# Patient Record
Sex: Male | Born: 1985
Health system: Southern US, Community
[De-identification: ages and names within clinical notes are randomized; demographics above are authoritative.]

## PROBLEM LIST (undated history)

## (undated) HISTORY — PX: FOOT SURGERY: SHX648

---
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
## 2009-03-10 ENCOUNTER — Emergency Department (HOSPITAL_COMMUNITY): Admission: EM | Admit: 2009-03-10 | Discharge: 2009-03-10 | Payer: Self-pay | Admitting: Family Medicine

## 2011-02-15 LAB — GC/CHLAMYDIA PROBE AMP, GENITAL: GC Probe Amp, Genital: POSITIVE — AB

## 2012-12-24 ENCOUNTER — Emergency Department (HOSPITAL_COMMUNITY)
Admission: EM | Admit: 2012-12-24 | Discharge: 2012-12-24 | Disposition: A | Discharge status: Home / Self Care | Payer: No Typology Code available for payment source | Attending: Emergency Medicine | Admitting: Emergency Medicine

## 2012-12-24 ENCOUNTER — Encounter (HOSPITAL_COMMUNITY): Payer: Self-pay | Admitting: *Deleted

## 2012-12-24 DIAGNOSIS — S4980XA Other specified injuries of shoulder and upper arm, unspecified arm, initial encounter: Secondary | ICD-10-CM | POA: Insufficient documentation

## 2012-12-24 DIAGNOSIS — F17200 Nicotine dependence, unspecified, uncomplicated: Secondary | ICD-10-CM | POA: Insufficient documentation

## 2012-12-24 DIAGNOSIS — IMO0002 Reserved for concepts with insufficient information to code with codable children: Secondary | ICD-10-CM | POA: Insufficient documentation

## 2012-12-24 DIAGNOSIS — Y92410 Unspecified street and highway as the place of occurrence of the external cause: Secondary | ICD-10-CM | POA: Insufficient documentation

## 2012-12-24 DIAGNOSIS — Y9389 Activity, other specified: Secondary | ICD-10-CM | POA: Insufficient documentation

## 2012-12-24 DIAGNOSIS — M549 Dorsalgia, unspecified: Secondary | ICD-10-CM

## 2012-12-24 DIAGNOSIS — S46909A Unspecified injury of unspecified muscle, fascia and tendon at shoulder and upper arm level, unspecified arm, initial encounter: Secondary | ICD-10-CM | POA: Insufficient documentation

## 2012-12-24 MED ORDER — TRAMADOL HCL 50 MG PO TABS: 50.0000 mg | ORAL_TABLET | Freq: Four times a day (QID) | ORAL | Status: DC | PRN
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
## 2012-12-26 NOTE — ED Provider Notes (Signed)
Medical screening examination/treatment/procedure(s) were performed by non-physician practitioner and as supervising physician I was immediately available for consultation/collaboration.   Suzi Roots, MD 12/26/12 [phone]

## 2013-01-05 ENCOUNTER — Emergency Department (HOSPITAL_COMMUNITY)
Admission: EM | Admit: 2013-01-05 | Discharge: 2013-01-05 | Disposition: A | Discharge status: Home / Self Care | Payer: No Typology Code available for payment source | Attending: Emergency Medicine | Admitting: Emergency Medicine

## 2013-01-05 ENCOUNTER — Emergency Department (HOSPITAL_COMMUNITY): Payer: No Typology Code available for payment source

## 2013-01-05 ENCOUNTER — Encounter (HOSPITAL_COMMUNITY): Payer: Self-pay

## 2013-01-05 DIAGNOSIS — S32009A Unspecified fracture of unspecified lumbar vertebra, initial encounter for closed fracture: Secondary | ICD-10-CM

## 2013-01-05 DIAGNOSIS — F17200 Nicotine dependence, unspecified, uncomplicated: Secondary | ICD-10-CM | POA: Insufficient documentation

## 2013-01-05 DIAGNOSIS — R319 Hematuria, unspecified: Secondary | ICD-10-CM | POA: Insufficient documentation

## 2013-01-05 DIAGNOSIS — Y939 Activity, unspecified: Secondary | ICD-10-CM | POA: Insufficient documentation

## 2013-01-05 DIAGNOSIS — Y929 Unspecified place or not applicable: Secondary | ICD-10-CM | POA: Insufficient documentation

## 2013-01-05 DIAGNOSIS — R109 Unspecified abdominal pain: Secondary | ICD-10-CM | POA: Insufficient documentation

## 2013-01-05 DIAGNOSIS — X58XXXA Exposure to other specified factors, initial encounter: Secondary | ICD-10-CM | POA: Insufficient documentation

## 2013-01-05 LAB — BASIC METABOLIC PANEL
BUN: 23 mg/dL (ref 6–23)
Calcium: 8.9 mg/dL (ref 8.4–10.5)
Creatinine, Ser: 1.36 mg/dL — ABNORMAL HIGH (ref 0.50–1.35)
GFR calc Af Amer: 83 mL/min — ABNORMAL LOW (ref >90)
GFR calc non Af Amer: 71 mL/min — ABNORMAL LOW (ref >90)
Glucose, Bld: 104 mg/dL — ABNORMAL HIGH (ref 70–99)

## 2013-01-05 LAB — URINALYSIS, ROUTINE W REFLEX MICROSCOPIC
Bilirubin Urine: NEGATIVE
Hgb urine dipstick: NEGATIVE
Ketones, ur: NEGATIVE mg/dL
Nitrite: NEGATIVE
Urobilinogen, UA: 0.2 mg/dL (ref 0.0–1.0)

## 2013-01-05 MED ORDER — HYDROCODONE-ACETAMINOPHEN 5-325 MG PO TABS: 2.0000 | ORAL_TABLET | ORAL | Status: DC | PRN

## 2013-01-05 MED ORDER — HYDROCODONE-ACETAMINOPHEN 5-325 MG PO TABS
2.0000 | ORAL_TABLET | Freq: Once | ORAL | Status: AC
  Administered 2013-01-05: 2 via ORAL
  Filled 2013-01-05: qty 2
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
## 2013-01-05 NOTE — ED Notes (Signed)
Pt presents with no acute distress.  Pt reports stomach pain x 1 month pain is no t constant- no correlation with pain.  Blood appeared 2 weeks ago. Pt denies discharge.  Pt is sexually active.  Pt had sex last 6 weeks ago. Denies any discharge. Pt c/o of RLQ pain and Rt flank pain  Denies N/V/D and fever

## 2013-01-05 NOTE — ED Notes (Signed)
Patient transported to CT 

## 2013-01-06 NOTE — ED Provider Notes (Signed)
Medical screening examination/treatment/procedure(s) were performed by non-physician practitioner and as supervising physician I was immediately available for consultation/collaboration.   Suzi Roots, MD 01/06/13 [phone]

## 2014-01-30 ENCOUNTER — Emergency Department (HOSPITAL_COMMUNITY)
Admission: EM | Admit: 2014-01-30 | Discharge: 2014-01-31 | Disposition: A | Discharge status: Home / Self Care | Payer: Self-pay | Attending: Emergency Medicine | Admitting: Emergency Medicine

## 2014-01-30 ENCOUNTER — Encounter (HOSPITAL_COMMUNITY): Payer: Self-pay | Admitting: Emergency Medicine

## 2014-01-30 DIAGNOSIS — L03119 Cellulitis of unspecified part of limb: Principal | ICD-10-CM

## 2014-01-30 DIAGNOSIS — L02619 Cutaneous abscess of unspecified foot: Secondary | ICD-10-CM | POA: Insufficient documentation

## 2014-01-30 DIAGNOSIS — L03116 Cellulitis of left lower limb: Secondary | ICD-10-CM

## 2014-01-30 DIAGNOSIS — IMO0001 Reserved for inherently not codable concepts without codable children: Secondary | ICD-10-CM | POA: Insufficient documentation

## 2014-01-30 DIAGNOSIS — F17200 Nicotine dependence, unspecified, uncomplicated: Secondary | ICD-10-CM | POA: Insufficient documentation

## 2014-01-30 NOTE — ED Notes (Signed)
Pt states he thinks something bit him on the left foot on Monday  Pt states it started as a small bump on his 4th toe and now he has redness and swelling to his entire foot  Pt states it is very painful

## 2014-01-31 MED ORDER — IBUPROFEN 200 MG PO TABS
600.0000 mg | ORAL_TABLET | Freq: Once | ORAL | Status: AC
  Administered 2014-01-31: 600 mg via ORAL
  Filled 2014-01-31: qty 3

## 2014-01-31 MED ORDER — HYDROCODONE-ACETAMINOPHEN 5-325 MG PO TABS: 1.0000 | ORAL_TABLET | Freq: Four times a day (QID) | ORAL | Status: DC | PRN

## 2014-01-31 MED ORDER — SULFAMETHOXAZOLE-TMP DS 800-160 MG PO TABS
2.0000 | ORAL_TABLET | Freq: Once | ORAL | Status: AC
  Administered 2014-01-31: 2 via ORAL
  Filled 2014-01-31: qty 2

## 2014-01-31 MED ORDER — SULFAMETHOXAZOLE-TRIMETHOPRIM 800-160 MG PO TABS: 1.0000 | ORAL_TABLET | Freq: Two times a day (BID) | ORAL | Status: DC

## 2014-01-31 MED ORDER — CEPHALEXIN 500 MG PO CAPS
500.0000 mg | ORAL_CAPSULE | Freq: Once | ORAL | Status: AC
  Administered 2014-01-31: 500 mg via ORAL
  Filled 2014-01-31: qty 1

## 2014-01-31 MED ORDER — CEPHALEXIN 500 MG PO CAPS: 500.0000 mg | ORAL_CAPSULE | Freq: Four times a day (QID) | ORAL | Status: DC

## 2014-01-31 MED ORDER — IBUPROFEN 600 MG PO TABS: 600.0000 mg | ORAL_TABLET | Freq: Four times a day (QID) | ORAL | Status: DC | PRN

## 2014-01-31 MED ORDER — HYDROCODONE-ACETAMINOPHEN 5-325 MG PO TABS
1.0000 | ORAL_TABLET | Freq: Once | ORAL | Status: AC
Start: 2014-01-31 — End: 2014-01-31
  Administered 2014-01-31: 1 via ORAL
  Filled 2014-01-31: qty 1

## 2014-01-31 NOTE — ED Provider Notes (Addendum)
CSN: [id number]     Arrival date & time 01/30/14  2226 History   First MD Initiated Contact with Patient 01/30/14 2338     Chief Complaint  Patient presents with  . Foot Pain     (Consider location/radiation/quality/duration/timing/severity/associated sxs/prior Treatment) HPI Comments: Pt comes in with cc of left foot pain. He noticed a little bump on his 4th toe on Monday, and overtime, soke pain in that area. This evening however, the pain shot up quickly, along with increased swelling and redness. He has no medical problems, no ivda. No n/v/f/c. Pt has pain over the distal part of his foot, and is described as throbbing pain.  The history is provided by the patient.    History reviewed. No pertinent past medical history. History reviewed. No pertinent past surgical history. History reviewed. No pertinent family history. History  Substance Use Topics  . Smoking status: Current Every Day Smoker    Types: Cigarettes  . Smokeless tobacco: Never Used  . Alcohol Use: No    Review of Systems  Constitutional: Negative for fever and chills.  Musculoskeletal: Positive for arthralgias and myalgias.  Skin: Positive for rash.  Allergic/Immunologic: Negative for immunocompromised state.  Hematological: Does not bruise/bleed easily.      Allergies  Review of patient's allergies indicates no known allergies.  Home Medications   Current Outpatient Rx  Name  Route  Sig  Dispense  Refill  . acetaminophen (TYLENOL) 500 MG tablet   Oral   Take 1,000 mg by mouth every 6 (six) hours as needed for headache.         . tetrahydrozoline 0.05 % ophthalmic solution   Both Eyes   Place 1 drop into both eyes as needed (dry eyes).          BP 145/85  Pulse 95  Temp(Src) 98.6 F (37 C) (Oral)  Resp 20  SpO2 98% Physical Exam  Nursing note and vitals reviewed. Constitutional: He is oriented to person, place, and time. He appears well-developed.  HENT:  Head: Normocephalic and  atraumatic.  Eyes: Conjunctivae and EOM are normal. Pupils are equal, round, and reactive to light.  Neck: Normal range of motion. Neck supple.  Cardiovascular: Normal rate and regular rhythm.   Pulmonary/Chest: Effort normal and breath sounds normal.  Abdominal: Soft. Bowel sounds are normal. He exhibits no distension. There is no tenderness. There is no rebound and no guarding.  Musculoskeletal:  Left foot - erythema, edema and warm to touch. Pt has tenderness over the distal 1/3rd foot - most tender over the MCP. Please see the attached picture  Neurological: He is alert and oriented to person, place, and time.  Skin: Skin is warm.    ED Course  Procedures (including critical care time) Labs Review Labs Reviewed - No data to display Imaging Review No results found.   EKG Interpretation None     LEFT FOOT   INCREASED TENDERNESS AND EDEMA AT THE TOES    MDM   Final diagnoses:  None    Pt comes in with cellulitis of the foot. No evidence of abscess - but there is fair amount of edema.  Pt has no insurance  -will start him on bactrim and keflex. Will demarcate the wound. Pt is immunocompetent, 0 SIRs criteria, and i believe will respond well to outpatient therapy. so we have asked him to come back for wound follow pp in 2 days. Return precautions discussed.   Derwood KaplanAnkit Ellijah Leffel, MD 01/31/14 0104  Deloras Reichard  Rhunette Croft, MD 01/31/14 1610

## 2014-01-31 NOTE — Discharge Instructions (Signed)
You have foot cellulitis. Antibiotics started in the ER. Return to the ER if the symptoms are getting worse (fevers, pus drainage, worsening redness, pain, swelling) immediately, otherwise, see THE URGENT [facility]ARE in 2 days for wound recheck.   Cellulitis Cellulitis is an infection of the skin and the tissue beneath it. The infected area is usually red and tender. Cellulitis occurs most often in the arms and lower legs.  CAUSES  Cellulitis is caused by bacteria that enter the skin through cracks or cuts in the skin. The most common types of bacteria that cause cellulitis are Staphylococcus and Streptococcus. SYMPTOMS   Redness and warmth.  Swelling.  Tenderness or pain.  Fever. DIAGNOSIS  Your caregiver can usually determine what is wrong based on a physical exam. Blood tests may also be done. TREATMENT  Treatment usually involves taking an antibiotic medicine. HOME CARE INSTRUCTIONS   Take your antibiotics as directed. Finish them even if you start to feel better.  Keep the infected arm or leg elevated to reduce swelling.  Apply a warm cloth to the affected area up to 4 times per day to relieve pain.  Only take over-the-counter or prescription medicines for pain, discomfort, or fever as directed by your caregiver.  Keep all follow-up appointments as directed by your caregiver. SEEK MEDICAL CARE IF:   You notice red streaks coming from the infected area.  Your red area gets larger or turns dark in color.  Your bone or joint underneath the infected area becomes painful after the skin has healed.  Your infection returns in the same area or another area.  You notice a swollen bump in the infected area.  You develop new symptoms. SEEK IMMEDIATE MEDICAL CARE IF:   You have a fever.  You feel very sleepy.  You develop vomiting or diarrhea.  You have a general ill feeling (malaise) with muscle aches and pains. MAKE SURE YOU:   Understand these instructions.  Will watch  your condition.  Will get help right away if you are not doing well or get worse. Document Released: 08/03/2005 Document Revised: 04/24/2012 Document Reviewed: 01/09/2012 [facility] Patient Information 2014 [place], [place]. RICE: Routine Care for Injuries The routine care of many injuries includes Rest, Ice, Compression, and Elevation (RICE). HOME CARE INSTRUCTIONS  Rest is needed to allow your body to heal. Routine activities can usually be resumed when comfortable. Injured tendons and bones can take up to 6 weeks to heal. Tendons are the cord-like structures that attach muscle to bone.  Ice following an injury helps keep the swelling down and reduces pain.  Put ice in a plastic bag.  Place a towel between your skin and the bag.  Leave the ice on for 15-20 minutes, 03-04 times a day. Do this while awake, for the first 24 to 48 hours. After that, continue as directed by your caregiver.  Compression helps keep swelling down. It also gives support and helps with discomfort. If an elastic bandage has been applied, it should be removed and reapplied every 3 to 4 hours. It should not be applied tightly, but firmly enough to keep swelling down. Watch fingers or toes for swelling, bluish discoloration, coldness, numbness, or excessive pain. If any of these problems occur, remove the bandage and reapply loosely. Contact your caregiver if these problems continue.  Elevation helps reduce swelling and decreases pain. With extremities, such as the arms, hands, legs, and feet, the injured area should be placed near or above the level of the heart,  if possible. SEEK IMMEDIATE MEDICAL CARE IF:  You have persistent pain and swelling.  You develop redness, numbness, or unexpected weakness.  Your symptoms are getting worse rather than improving after several days. These symptoms may indicate that further evaluation or further X-rays are needed. Sometimes, X-rays may not show a small broken bone (fracture)  until 1 week or 10 days later. Make a follow-up appointment with your caregiver. Ask when your X-ray results will be ready. Make sure you get your X-ray results. Document Released: 02/05/2001 Document Revised: 01/16/2012 Document Reviewed: 03/25/2011 [facility] Patient Information 2014 [place].

## 2014-02-01 ENCOUNTER — Inpatient Hospital Stay (HOSPITAL_COMMUNITY): Payer: Self-pay

## 2014-02-01 ENCOUNTER — Encounter (HOSPITAL_COMMUNITY): Payer: Self-pay | Admitting: Emergency Medicine

## 2014-02-01 ENCOUNTER — Inpatient Hospital Stay (HOSPITAL_COMMUNITY)
Admission: EM | Admit: 2014-02-01 | Discharge: 2014-02-13 | DRG: 573 | Disposition: A | Discharge status: Home Health | Payer: Self-pay | Attending: Internal Medicine | Admitting: Internal Medicine

## 2014-02-01 ENCOUNTER — Emergency Department (HOSPITAL_COMMUNITY): Payer: Self-pay

## 2014-02-01 DIAGNOSIS — B351 Tinea unguium: Secondary | ICD-10-CM | POA: Diagnosis present

## 2014-02-01 DIAGNOSIS — F1210 Cannabis abuse, uncomplicated: Secondary | ICD-10-CM | POA: Diagnosis present

## 2014-02-01 DIAGNOSIS — R6510 Systemic inflammatory response syndrome (SIRS) of non-infectious origin without acute organ dysfunction: Secondary | ICD-10-CM

## 2014-02-01 DIAGNOSIS — K5900 Constipation, unspecified: Secondary | ICD-10-CM | POA: Diagnosis present

## 2014-02-01 DIAGNOSIS — L03119 Cellulitis of unspecified part of limb: Principal | ICD-10-CM

## 2014-02-01 DIAGNOSIS — Z79899 Other long term (current) drug therapy: Secondary | ICD-10-CM

## 2014-02-01 DIAGNOSIS — L03116 Cellulitis of left lower limb: Secondary | ICD-10-CM | POA: Diagnosis present

## 2014-02-01 DIAGNOSIS — H04129 Dry eye syndrome of unspecified lacrimal gland: Secondary | ICD-10-CM | POA: Diagnosis present

## 2014-02-01 DIAGNOSIS — L02619 Cutaneous abscess of unspecified foot: Principal | ICD-10-CM | POA: Diagnosis present

## 2014-02-01 DIAGNOSIS — L089 Local infection of the skin and subcutaneous tissue, unspecified: Secondary | ICD-10-CM | POA: Diagnosis present

## 2014-02-01 DIAGNOSIS — D72829 Elevated white blood cell count, unspecified: Secondary | ICD-10-CM | POA: Diagnosis present

## 2014-02-01 DIAGNOSIS — L988 Other specified disorders of the skin and subcutaneous tissue: Secondary | ICD-10-CM | POA: Diagnosis present

## 2014-02-01 DIAGNOSIS — A4902 Methicillin resistant Staphylococcus aureus infection, unspecified site: Secondary | ICD-10-CM | POA: Diagnosis present

## 2014-02-01 DIAGNOSIS — A419 Sepsis, unspecified organism: Secondary | ICD-10-CM | POA: Diagnosis present

## 2014-02-01 DIAGNOSIS — F17200 Nicotine dependence, unspecified, uncomplicated: Secondary | ICD-10-CM | POA: Diagnosis present

## 2014-02-01 DIAGNOSIS — E875 Hyperkalemia: Secondary | ICD-10-CM | POA: Diagnosis present

## 2014-02-01 LAB — CBC
HCT: 44.7 % (ref 39.0–52.0)
HCT: 46 % (ref 39.0–52.0)
HEMOGLOBIN: 16.2 g/dL (ref 13.0–17.0)
Hemoglobin: 15.6 g/dL (ref 13.0–17.0)
MCH: 31.1 pg (ref 26.0–34.0)
MCH: 31.5 pg (ref 26.0–34.0)
MCHC: 34.9 g/dL (ref 30.0–36.0)
MCHC: 35.2 g/dL (ref 30.0–36.0)
MCV: 89.2 fL (ref 78.0–100.0)
MCV: 89.5 fL (ref 78.0–100.0)
PLATELETS: 179 10*3/uL (ref 150–400)
PLATELETS: 154 10*3/uL (ref 150–400)
RBC: 5.01 MIL/uL (ref 4.22–5.81)
RBC: 5.14 MIL/uL (ref 4.22–5.81)
RDW: 13.1 % (ref 11.5–15.5)
RDW: 13.1 % (ref 11.5–15.5)
WBC: 16.1 10*3/uL — AB (ref 4.0–10.5)
WBC: 14 10*3/uL — ABNORMAL HIGH (ref 4.0–10.5)

## 2014-02-01 LAB — COMPREHENSIVE METABOLIC PANEL
ALT: 14 U/L (ref 0–53)
AST: 14 U/L (ref 0–37)
Albumin: 3.2 g/dL — ABNORMAL LOW (ref 3.5–5.2)
Alkaline Phosphatase: 86 U/L (ref 39–117)
BUN: 17 mg/dL (ref 6–23)
CO2: 24 mEq/L (ref 19–32)
Calcium: 8.7 mg/dL (ref 8.4–10.5)
Chloride: 98 mEq/L (ref 96–112)
Creatinine, Ser: 1 mg/dL (ref 0.50–1.35)
GFR calc Af Amer: >90 mL/min (ref >90)
GFR calc non Af Amer: >90 mL/min (ref >90)
GLUCOSE: 80 mg/dL (ref 70–99)
Potassium: 4.8 mEq/L (ref 3.7–5.3)
SODIUM: 135 meq/L — AB (ref 137–147)
TOTAL PROTEIN: 6.8 g/dL (ref 6.0–8.3)
Total Bilirubin: 0.4 mg/dL (ref 0.3–1.2)

## 2014-02-01 LAB — I-STAT CHEM 8, ED
BUN: 28 mg/dL — AB (ref 6–23)
CHLORIDE: 104 meq/L (ref 96–112)
Calcium, Ion: 1.03 mmol/L — ABNORMAL LOW (ref 1.12–1.23)
Creatinine, Ser: 1.2 mg/dL (ref 0.50–1.35)
Glucose, Bld: 104 mg/dL — ABNORMAL HIGH (ref 70–99)
HCT: 49 % (ref 39.0–52.0)
Hemoglobin: 16.7 g/dL (ref 13.0–17.0)
Potassium: 5.7 mEq/L — ABNORMAL HIGH (ref 3.7–5.3)
SODIUM: 136 meq/L — AB (ref 137–147)
TCO2: 27 mmol/L (ref 0–100)

## 2014-02-01 LAB — MAGNESIUM: MAGNESIUM: 2 mg/dL (ref 1.5–2.5)

## 2014-02-01 LAB — PHOSPHORUS: Phosphorus: 3.6 mg/dL (ref 2.3–4.6)

## 2014-02-01 LAB — SEDIMENTATION RATE: SED RATE: 15 mm/h (ref 0–16)

## 2014-02-01 MED ORDER — IOHEXOL 300 MG/ML  SOLN
100.0000 mL | Freq: Once | INTRAMUSCULAR | Status: AC | PRN
  Administered 2014-02-01: 100 mL via INTRAVENOUS

## 2014-02-01 MED ORDER — VANCOMYCIN HCL IN DEXTROSE 1-5 GM/200ML-% IV SOLN
1000.0000 mg | Freq: Two times a day (BID) | INTRAVENOUS | Status: DC
  Administered 2014-02-01 – 2014-02-04 (×6): 1000 mg via INTRAVENOUS
  Filled 2014-02-01 (×8): qty 200

## 2014-02-01 MED ORDER — ACETAMINOPHEN 500 MG PO TABS
1000.0000 mg | ORAL_TABLET | Freq: Four times a day (QID) | ORAL | Status: DC | PRN
  Administered 2014-02-01 – 2014-02-02 (×2): 1000 mg via ORAL
  Filled 2014-02-01 (×2): qty 2

## 2014-02-01 MED ORDER — DIPHENHYDRAMINE HCL 25 MG PO CAPS
25.0000 mg | ORAL_CAPSULE | Freq: Four times a day (QID) | ORAL | Status: DC | PRN
  Administered 2014-02-01 – 2014-02-02 (×2): 25 mg via ORAL
  Filled 2014-02-01 (×2): qty 1

## 2014-02-01 MED ORDER — HEPARIN SODIUM (PORCINE) 5000 UNIT/ML IJ SOLN
5000.0000 [IU] | Freq: Three times a day (TID) | INTRAMUSCULAR | Status: DC
  Administered 2014-02-01 – 2014-02-13 (×30): 5000 [IU] via SUBCUTANEOUS
  Filled 2014-02-01 (×39): qty 1

## 2014-02-01 MED ORDER — PIPERACILLIN-TAZOBACTAM 3.375 G IVPB
3.3750 g | Freq: Once | INTRAVENOUS | Status: AC
Start: 2014-02-01 — End: 2014-02-01
  Administered 2014-02-01: 3.375 g via INTRAVENOUS
  Filled 2014-02-01 (×2): qty 50

## 2014-02-01 MED ORDER — HYDROMORPHONE HCL PF 1 MG/ML IJ SOLN
1.0000 mg | INTRAMUSCULAR | Status: DC | PRN
  Administered 2014-02-01 – 2014-02-06 (×14): 1 mg via INTRAVENOUS
  Filled 2014-02-01 (×15): qty 1

## 2014-02-01 MED ORDER — HYDROMORPHONE HCL PF 2 MG/ML IJ SOLN
1.5000 mg | Freq: Once | INTRAMUSCULAR | Status: AC
  Administered 2014-02-01: 1.5 mg via INTRAVENOUS
  Filled 2014-02-01: qty 1

## 2014-02-01 MED ORDER — NAPHAZOLINE HCL 0.1 % OP SOLN: 1.0000 [drp] | Freq: Four times a day (QID) | OPHTHALMIC | Status: DC | PRN

## 2014-02-01 MED ORDER — SODIUM CHLORIDE 0.9 % IV SOLN
INTRAVENOUS | Status: DC
  Administered 2014-02-01 – 2014-02-04 (×3): via INTRAVENOUS

## 2014-02-01 MED ORDER — HYDROMORPHONE HCL PF 1 MG/ML IJ SOLN
1.0000 mg | Freq: Once | INTRAMUSCULAR | Status: AC
Start: 2014-02-01 — End: 2014-02-01
  Administered 2014-02-01: 1 mg via INTRAVENOUS
  Filled 2014-02-01: qty 1

## 2014-02-01 MED ORDER — SODIUM CHLORIDE 0.9 % IV SOLN
INTRAVENOUS | Status: DC
  Administered 2014-02-01: 14:00:00 via INTRAVENOUS

## 2014-02-01 MED ORDER — VANCOMYCIN HCL IN DEXTROSE 1-5 GM/200ML-% IV SOLN
1000.0000 mg | Freq: Once | INTRAVENOUS | Status: AC
  Administered 2014-02-01: 1000 mg via INTRAVENOUS
  Filled 2014-02-01: qty 200

## 2014-02-01 MED ORDER — POLYETHYLENE GLYCOL 3350 17 G PO PACK
17.0000 g | PACK | Freq: Every day | ORAL | Status: DC | PRN
Start: 2014-02-01 — End: 2014-02-08
  Filled 2014-02-01: qty 1

## 2014-02-01 MED ORDER — HYDROMORPHONE HCL PF 1 MG/ML IJ SOLN
1.0000 mg | Freq: Once | INTRAMUSCULAR | Status: AC
  Administered 2014-02-01: 1 mg via INTRAVENOUS
  Filled 2014-02-01: qty 1

## 2014-02-01 MED ORDER — PIPERACILLIN-TAZOBACTAM 3.375 G IVPB
3.3750 g | Freq: Three times a day (TID) | INTRAVENOUS | Status: DC
  Administered 2014-02-01 – 2014-02-07 (×17): 3.375 g via INTRAVENOUS
  Filled 2014-02-01 (×20): qty 50

## 2014-02-01 MED ORDER — DOCUSATE SODIUM 100 MG PO CAPS
100.0000 mg | ORAL_CAPSULE | Freq: Two times a day (BID) | ORAL | Status: DC
  Administered 2014-02-01 – 2014-02-08 (×13): 100 mg via ORAL
  Filled 2014-02-01 (×15): qty 1

## 2014-02-01 MED ORDER — ONDANSETRON HCL 4 MG/2ML IJ SOLN: 4.0000 mg | Freq: Four times a day (QID) | INTRAMUSCULAR | Status: DC | PRN

## 2014-02-01 MED ORDER — OXYCODONE HCL 5 MG PO TABS
10.0000 mg | ORAL_TABLET | ORAL | Status: DC | PRN
  Administered 2014-02-01 – 2014-02-03 (×5): 10 mg via ORAL
  Filled 2014-02-01 (×5): qty 2

## 2014-02-01 MED ORDER — ONDANSETRON HCL 4 MG PO TABS
4.0000 mg | ORAL_TABLET | Freq: Four times a day (QID) | ORAL | Status: DC | PRN
  Filled 2014-02-01: qty 1

## 2014-02-01 NOTE — ED Provider Notes (Signed)
CSN: [id number]     Arrival date & time 02/01/14  0900 History   First MD Initiated Contact with Patient 02/01/14 0913     Chief Complaint  Patient presents with  . Foot Swelling     (Consider location/radiation/quality/duration/timing/severity/associated sxs/prior Treatment) HPI Complains of pain and swelling at left foot onset 5 days ago. The patient seen here 2 days ago for same complaint. Treated with Bactrim DS, Keflex, ibuprofen and Percocet without adequate pain relief states his foot feels worse today. He's noticed a small blister at the distal aspect of his left foot this morning. Pain is worse with movement or pressure not improved by anything no vomiting no nausea no fever no other associated symptoms. History reviewed. No pertinent past medical history. past medicalhistory negative History reviewed. No pertinent past surgical history. History reviewed. No pertinent family history. History  Substance Use Topics  . Smoking status: Current Every Day Smoker    Types: Cigarettes  . Smokeless tobacco: Never Used  . Alcohol Use: No   positive marijuana use  Review of Systems  Constitutional: Negative.   HENT: Negative.   Respiratory: Negative.   Cardiovascular: Negative.   Gastrointestinal: Negative.   Musculoskeletal: Negative.   Skin: Positive for wound.  Neurological: Negative.   Psychiatric/Behavioral: Negative.   All other systems reviewed and are negative.      Allergies  Review of patient's allergies indicates no known allergies.  Home Medications   Current Outpatient Rx  Name  Route  Sig  Dispense  Refill  . acetaminophen (TYLENOL) 500 MG tablet   Oral   Take 1,000 mg by mouth every 6 (six) hours as needed for headache.         . cephALEXin (KEFLEX) 500 MG capsule   Oral   Take 1 capsule (500 mg total) by mouth 4 (four) times daily.   40 capsule   0   . HYDROcodone-acetaminophen (NORCO/VICODIN) 5-325 MG per tablet   Oral   Take 1 tablet by  mouth every 6 (six) hours as needed.   10 tablet   0   . ibuprofen (ADVIL,MOTRIN) 600 MG tablet   Oral   Take 1 tablet (600 mg total) by mouth every 6 (six) hours as needed.   30 tablet   0   . sulfamethoxazole-trimethoprim (SEPTRA DS) 800-160 MG per tablet   Oral   Take 1 tablet by mouth 2 (two) times daily.   20 tablet   0   . tetrahydrozoline 0.05 % ophthalmic solution   Both Eyes   Place 1 drop into both eyes as needed (dry eyes).          BP 136/82  Pulse 95  Temp(Src) 97.8 F (36.6 C) (Oral)  Resp 16  SpO2 100% Physical Exam  Nursing note and vitals reviewed. Constitutional: He appears well-developed and well-nourished.  HENT:  Head: Normocephalic and atraumatic.  Eyes: Conjunctivae are normal. Pupils are equal, round, and reactive to light.  Neck: Neck supple. No tracheal deviation present. No thyromegaly present.  Cardiovascular: Normal rate and regular rhythm.   No murmur heard. Pulmonary/Chest: Effort normal and breath sounds normal.  Abdominal: Soft. Bowel sounds are normal. He exhibits no distension. There is no tenderness.  Musculoskeletal: Normal range of motion. He exhibits tenderness. He exhibits no edema.  Left lower extremity dorsum of foot is swollen. There is a dime-sized blister at the distal aspect of the dorsal foot immediately proximal to the fourth and fifth toes. Dorsum of foot is  red warm and swollen the redness extends up to distal two thirds of lower leg. There are no red streak proximal to light. No inguinal nodes. DP pulse 2+ All other extremities without redness swelling or tenderness neurovascularly intact  Neurological: He is alert. Coordination normal.  Skin: Skin is warm and dry. No rash noted.  Reddened skin with blister on left lower extremity otherwise without rash  Psychiatric: He has a normal mood and affect.   Procedure time out performed. Blister on left foot cleansed with alcohol and pus mixed with blood aspirated with 25  gauge needle and fluid placed on culturette and sent for culture ED Course  Procedures (including critical care time) Labs Review Labs Reviewed - No data to display Imaging Review No results found.   EKG Interpretation None     11:50 am pain control with treatment with intravenous hydromorphone Xray viewed by me Results for orders placed during the hospital encounter of 02/01/14  CBC      Result Value Ref Range   WBC 16.1 (*) 4.0 - 10.5 K/uL   RBC 5.01  4.22 - 5.81 MIL/uL   Hemoglobin 15.6  13.0 - 17.0 g/dL   HCT 16.1  09.6 - 04.5 %   MCV 89.2  78.0 - 100.0 fL   MCH 31.1  26.0 - 34.0 pg   MCHC 34.9  30.0 - 36.0 g/dL   RDW 40.9  81.1 - 91.4 %   Platelets 179  150 - 400 K/uL  I-STAT CHEM 8, ED      Result Value Ref Range   Sodium 136 (*) 137 - 147 mEq/L   Potassium 5.7 (*) 3.7 - 5.3 mEq/L   Chloride 104  96 - 112 mEq/L   BUN 28 (*) 6 - 23 mg/dL   Creatinine, Ser 7.82  0.50 - 1.35 mg/dL   Glucose, Bld 956 (*) 70 - 99 mg/dL   Calcium, Ion 2.13 (*) 1.12 - 1.23 mmol/L   TCO2 27  0 - 100 mmol/L   Hemoglobin 16.7  13.0 - 17.0 g/dL   HCT 08.6  57.8 - 46.9 %   Dg Foot Complete Left  02/01/2014   CLINICAL DATA:  Redness and swelling overlying the lateral aspect of the foot.  EXAM: LEFT FOOT - COMPLETE 3+ VIEW  COMPARISON:  None.  FINDINGS: Normal anatomic alignment. No evidence for acute fracture or dislocation. Regional soft tissues demonstrate mild swelling about the forefoot.  IMPRESSION: Mild soft tissue swelling about the forefoot.  No evidence for underlying bony abnormality.   Electronically Signed   By: Annia Belt M.D.   On: 02/01/2014 11:22    xrays viewed by me  MDM   Final diagnoses:  None   Spoke with Dr.MaDERA. Plan admit for intravenous antibiotics and pain control Dx Soft tissue infection of left foot    Doug Sou, MD 02/01/14 1209
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
## 2014-02-01 NOTE — H&P (Signed)
[facility] History and Physical  Jason RuddyBryant J Duran ZOX:[id number] DOB: 1986-02-10 DOA: 02/01/2014  Referring physician: Dr. Ethelda ChickJacubowitz PCP: No primary provider on file.   Chief Complaint: Left foot erythema, swelling and pain  HPI: Jason Duran is a 28 y.o. male without any significant past medical history; came to ED complaining of about 5 days of left foot swelling, erythema and pain. Patient reports that the symptoms started on Monday (32215) and has been worsening since. Patient was seen on 01/30/2014 and empirically treated for cellulitis with a bactrim and Keflex; despite outpatient antibiotic therapy condition continue worsening and the patient noticed blister I./slightly no purulent discharge on the dorsal aspect of his foot, reason why he returned to the hospital for further evaluation and treatment. Patient denies any other acute complaints at this moment. In the ED patient had a right 95-100, no fever (but patient has been taking around-the-clock Tylenol and ibuprofen), leukocytosis (16.5 K) and worsening swelling/appearance of his foot [facility] has been called to admit the patient for further evaluation and treatment.   Review of Systems:  Negative except as otherwise mentioned on history of present illness.  History reviewed. No pertinent past medical history. History reviewed. No pertinent past surgical history. Social History:  reports that he has been smoking Cigarettes.  He has been smoking about 0.00 packs per day. He has never used smokeless tobacco. He reports that he uses illicit drugs (Marijuana). He reports that he does not drink alcohol.  No Known Allergies  Family history: Significant just for hypertension otherwise noncontributory.  Prior to Admission medications   Medication Sig Start Date End Date Taking? Authorizing Provider  acetaminophen (TYLENOL) 500 MG tablet Take 1,000 mg by mouth every 6 (six) hours as needed for headache.   Yes  Historical Provider, MD  cephALEXin (KEFLEX) 500 MG capsule Take 1 capsule (500 mg total) by mouth 4 (four) times daily. 01/31/14  Yes Derwood KaplanAnkit Nanavati, MD  HYDROcodone-acetaminophen (NORCO/VICODIN) 5-325 MG per tablet Take 1 tablet by mouth every 6 (six) hours as needed. 01/31/14  Yes Derwood KaplanAnkit Nanavati, MD  ibuprofen (ADVIL,MOTRIN) 600 MG tablet Take 1 tablet (600 mg total) by mouth every 6 (six) hours as needed. 01/31/14  Yes Derwood KaplanAnkit Nanavati, MD  sulfamethoxazole-trimethoprim (SEPTRA DS) 800-160 MG per tablet Take 1 tablet by mouth 2 (two) times daily. 01/31/14  Yes Derwood KaplanAnkit Nanavati, MD  tetrahydrozoline 0.05 % ophthalmic solution Place 1 drop into both eyes as needed (dry eyes).   Yes Historical Provider, MD   Physical Exam: Filed Vitals:   02/01/14 0908  BP: 136/82  Pulse: 95  Temp: 97.8 F (36.6 C)  Resp: 16    BP 136/82  Pulse 95  Temp(Src) 97.8 F (36.6 C) (Oral)  Resp 16  SpO2 100%  General:  Appears calm and comfortable; complaining of left foot pain. Afebrile Eyes: PERRL, normal lids, irises & conjunctiva ENT: grossly normal hearing, lips & tongue Neck: no LAD, masses or thyromegaly, no JVD Cardiovascular: Mild tachycardia; no m/r/g.  Telemetry: SR, no arrhythmias  Respiratory: CTA bilaterally, no w/r/r. Normal respiratory effort. Abdomen: soft, nt, nd and with positive bowel sounds. Skin: Left lower food with swelling, erythema and warmness sensation to touch extending up to mid shin and affecting mainly dorsal aspect of his leg. This up blister with purulent/70 nose drainage appreciated on the dorsal aspect of his food right above fourth toe. Musculoskeletal: grossly normal tone BUE/BLE; cellulitic changes as mentioned above otherwise no abnormalities appreciated on exam. Psychiatric: grossly normal  mood and affect, speech fluent and appropriate Neurologic: grossly non-focal.          Labs on Admission:  Basic Metabolic Panel:  Recent Labs Lab 02/01/14 1023  NA 136*  K  5.7*  CL 104  GLUCOSE 104*  BUN 28*  CREATININE 1.20   CBC:  Recent Labs Lab 02/01/14 1015 02/01/14 1023  WBC 16.1*  --   HGB 15.6 16.7  HCT 44.7 49.0  MCV 89.2  --   PLT 179  --     Radiological Exams on Admission: Dg Foot Complete Left  02/01/2014   CLINICAL DATA:  Redness and swelling overlying the lateral aspect of the foot.  EXAM: LEFT FOOT - COMPLETE 3+ VIEW  COMPARISON:  None.  FINDINGS: Normal anatomic alignment. No evidence for acute fracture or dislocation. Regional soft tissues demonstrate mild swelling about the forefoot.  IMPRESSION: Mild soft tissue swelling about the forefoot.  No evidence for underlying bony abnormality.   Electronically Signed   By: Annia Belt M.D.   On: 02/01/2014 11:22    Assessment/Plan 1-SIRS (systemic inflammatory response syndrome): Secondary to left foot cellulitis/abscess -Will check CT of his foot and depending results involve orthopedic service or Gen. surgery. -Cultures taken in the ED and patient started on broad-spectrum antibiotics -Will keep leg elevated as much as possible and apply warm compresses - Will provide supportive care and PRN analgesics/antipyretics  2-Leukocytosis: Secondary to problem #1. Cultures has been taken and patient started on IV antibiotics. -CBC to be repeated in a.m. to follow WBCs trend  3-Hyperkalemia: Mild hemolysis reported on i-STAT chemistry. Evaluation of cardiac monitor in the ED do not reveal any abnormalities. -Most likely due to hemolysis. Will repeat basic metabolic panel before taking any further therapeutic actions.  4-marijuana use: Sensation counseling has been provided.  5-dry eyes: Continue as needed ophthalmic solution.  DVT: Heparin   Code Status: Full Family Communication: no family at bedside Disposition Plan: LOS > 2 midnights, inpatient, med-surg bed  Time spent: 50 minutes  Vassie Loll [facility] Pager [phone]

## 2014-02-01 NOTE — ED Notes (Signed)
Pt states he was here Thursday for insect bite on foot and swelling. Pt states foot has become more swollen and dark blister has appeared on top of L foot.

## 2014-02-02 DIAGNOSIS — A419 Sepsis, unspecified organism: Secondary | ICD-10-CM

## 2014-02-02 DIAGNOSIS — L089 Local infection of the skin and subcutaneous tissue, unspecified: Secondary | ICD-10-CM

## 2014-02-02 DIAGNOSIS — B351 Tinea unguium: Secondary | ICD-10-CM | POA: Diagnosis present

## 2014-02-02 LAB — BASIC METABOLIC PANEL
BUN: 17 mg/dL (ref 6–23)
CO2: 25 meq/L (ref 19–32)
CREATININE: 1.16 mg/dL (ref 0.50–1.35)
Calcium: 8.8 mg/dL (ref 8.4–10.5)
Chloride: 95 mEq/L — ABNORMAL LOW (ref 96–112)
GFR, EST NON AFRICAN AMERICAN: 86 mL/min — AB (ref >90)
Glucose, Bld: 93 mg/dL (ref 70–99)
Potassium: 4.5 mEq/L (ref 3.7–5.3)
SODIUM: 133 meq/L — AB (ref 137–147)

## 2014-02-02 LAB — CBC
HCT: 45.6 % (ref 39.0–52.0)
Hemoglobin: 16.2 g/dL (ref 13.0–17.0)
MCH: 31.5 pg (ref 26.0–34.0)
MCHC: 35.5 g/dL (ref 30.0–36.0)
MCV: 88.7 fL (ref 78.0–100.0)
PLATELETS: 183 10*3/uL (ref 150–400)
RBC: 5.14 MIL/uL (ref 4.22–5.81)
RDW: 13 % (ref 11.5–15.5)
WBC: 14.4 10*3/uL — AB (ref 4.0–10.5)

## 2014-02-02 LAB — C-REACTIVE PROTEIN: CRP: 14.8 mg/dL — ABNORMAL HIGH (ref <0.60)

## 2014-02-02 MED ORDER — IBUPROFEN 800 MG PO TABS
400.0000 mg | ORAL_TABLET | Freq: Four times a day (QID) | ORAL | Status: DC | PRN
  Administered 2014-02-03 – 2014-02-04 (×2): 400 mg via ORAL
  Filled 2014-02-02 (×3): qty 1

## 2014-02-02 MED ORDER — OXYCODONE-ACETAMINOPHEN 5-325 MG PO TABS
1.0000 | ORAL_TABLET | ORAL | Status: DC | PRN
  Administered 2014-02-02 – 2014-02-04 (×7): 2 via ORAL
  Filled 2014-02-02 (×7): qty 2

## 2014-02-02 MED ORDER — VITAMINS A & D EX OINT
TOPICAL_OINTMENT | CUTANEOUS | Status: AC
  Administered 2014-02-02: 12:00:00
  Filled 2014-02-02: qty 5

## 2014-02-02 MED ORDER — TERBINAFINE HCL 1 % EX CREA
TOPICAL_CREAM | Freq: Two times a day (BID) | CUTANEOUS | Status: DC
  Administered 2014-02-02 – 2014-02-10 (×16): via TOPICAL
  Administered 2014-02-11: 1 via TOPICAL
  Administered 2014-02-12 – 2014-02-13 (×3): via TOPICAL
  Filled 2014-02-02 (×2): qty 12

## 2014-02-02 NOTE — Progress Notes (Addendum)
PROGRESS NOTE   Jason Duran DJM:[id number] DOB: [birth year] DOA: 02/01/2014 PCP: No primary provider on file.  Brief narrative: Jason Duran is an 28 y.o. male with no significant PMH who was admitted 02/01/14 with worsening left foot erythema, swelling and pain. Patient was treated as an outpatient for cellulitis with Bactrim and Keflex, and despite outpatient therapy, the patient's condition continued to worsen and he developed blistering.  Assessment/Plan: Principal Problem:   Sepsis secondary to cellulitis/soft tissue infection of left foot Patient met criteria for SIRS with an elevated heart rate and a WBC of 16.1. With left foot cellulitis, he meets the criteria for sepsis. He failed outpatient therapy with Bactrim and Keflex. Wound cultures were sent and the patient was placed on empiric vancomycin and Zosyn. ESR not elevated CRP 14.8. No current evidence of osteomyelitis based on CT scan/plain films done on admission.  Treat underlying onychomycosis.   Active Problems:   Leukocytosis Secondary to infection. WBC going down on broad-spectrum antibiotics.   Hyperkalemia Resolved with IVF.   Onychomycosis Lamisil cream BID ordered.   DVT Prophylaxis Continue Heparin.  Code Status: Full. Family Communication: Mother at bedside. Disposition Plan: Home when stable.   IV access:  Peripheral IV  Medical Consultants:  None  Other Consultants:  None  Anti-infectives:  Vancomycin 02/01/14--->  Zosyn 02/01/14--->   HPI/Subjective: Jason Duran is having foot pain.  He is extremely tender.  No nausea, vomiting, or diarrhea.    Objective: Filed Vitals:   02/01/14 1339 02/01/14 1800 02/01/14 2130 02/02/14 0545  BP: 131/79 146/84 138/78 126/91  Pulse: 67 80 71 84  Temp: 97.7 F (36.5 C) 98.7 F (37.1 C) 98 F (36.7 C) 98.5 F (36.9 C)  TempSrc: Oral Oral Oral Oral  Resp:  '18 20 20  ' Height: '5\' 7"'  (1.702 m)     Weight: 77.111 kg (170 lb)     SpO2: 98%  99% 100% 97%    Intake/Output Summary (Last 24 hours) at 02/02/14 0744 Last data filed at 02/02/14 0600  Gross per 24 hour  Intake   1270 ml  Output   1550 ml  Net   -280 ml    Exam: Gen:  NAD Cardiovascular:  RRR, No M/R/G Respiratory:  Lungs CTAB Gastrointestinal:  Abdomen soft, NT/ND, + BS Extremities:  Erythema and swelling to left foot as pictured below.  Nails thickened with skin changes consistent with dermatophyte infection. Left foot:  Purulent material/developing abscess with no significant change in erythema/swelling.  Data Reviewed: Basic Metabolic Panel:  Recent Labs Lab 02/01/14 1023 02/01/14 1420 02/02/14 0523  NA 136* 135* 133*  K 5.7* 4.8 4.5  CL 104 98 95*  CO2  --  24 25  GLUCOSE 104* 80 93  BUN 28* 17 17  CREATININE 1.20 1.00 1.16  CALCIUM  --  8.7 8.8  MG  --  2.0  --   PHOS  --  3.6  --    GFR Estimated Creatinine Clearance: 90.2 ml/min (by C-G formula based on Cr of 1.16). Liver Function Tests:  Recent Labs Lab 02/01/14 1420  AST 14  ALT 14  ALKPHOS 86  BILITOT 0.4  PROT 6.8  ALBUMIN 3.2*   CBC:  Recent Labs Lab 02/01/14 1015 02/01/14 1023 02/01/14 1420 02/02/14 0523  WBC 16.1*  --  14.0* 14.4*  HGB 15.6 16.7 16.2 16.2  HCT 44.7 49.0 46.0 45.6  MCV 89.2  --  89.5 88.7  PLT 179  --  154 183   Microbiology Recent Results (from the past 240 hour(s))  WOUND CULTURE     Status: None   Collection Time    02/01/14  9:48 AM      Result Value Ref Range Status   Specimen Description FOOT   Final   Special Requests Normal   Final   Gram Stain     Final   Value: NO WBC SEEN     RARE SQUAMOUS EPITHELIAL CELLS PRESENT     NO ORGANISMS SEEN     Performed at [facility]   Culture     Final   Value: NO GROWTH 1 DAY     Performed at [facility]   Report Status PENDING   Incomplete     Procedures and Diagnostic Studies: Ct Foot Left W Contrast  02/01/2014   CLINICAL DATA:  Left foot pain and swelling.   Suspected insect bite.  EXAM: CT OF THE LEFT FOOT WITH CONTRAST  TECHNIQUE: Multidetector CT imaging was performed following the standard protocol during bolus administration of intravenous contrast.  CONTRAST:  155m OMNIPAQUE IOHEXOL 300 MG/ML  SOLN  COMPARISON:  DG FOOT COMPLETE*L* dated 02/01/2014  FINDINGS: Considerable abnormal dorsal soft tissue swelling in the foot is. No drainable abscess. This tracks back to the lateral ankle.  No bony destructive findings characteristic of osteomyelitis. No significant arthropathy in the foot.  No malalignment at the Lisfranc joint.  IMPRESSION: Extensive dorsal subcutaneous edema in the foot tracking into the lateral ankle. No abscess or bony destructive findings characteristic of osteomyelitis noted.   Electronically Signed   By: WSherryl BartersM.D.   On: 02/01/2014 13:50   Dg Foot Complete Left  02/01/2014   CLINICAL DATA:  Redness and swelling overlying the lateral aspect of the foot.  EXAM: LEFT FOOT - COMPLETE 3+ VIEW  COMPARISON:  None.  FINDINGS: Normal anatomic alignment. No evidence for acute fracture or dislocation. Regional soft tissues demonstrate mild swelling about the forefoot.  IMPRESSION: Mild soft tissue swelling about the forefoot.  No evidence for underlying bony abnormality.   Electronically Signed   By: DLovey NewcomerM.D.   On: 02/01/2014 11:22    Scheduled Meds: . docusate sodium  100 mg Oral BID  . heparin  5,000 Units Subcutaneous 3 times per day  . piperacillin-tazobactam (ZOSYN)  IV  3.375 g Intravenous 3 times per day  . vancomycin  1,000 mg Intravenous Q12H   Continuous Infusions: . sodium chloride 75 mL/hr at 02/01/14 1520    Time spent: 35 minutes with > 50% of time discussing current diagnostic test results, clinical impression and plan of care with the patient and his mother.     LOS: 1 day   RAMA,CHRISTINA  [facility] Pager [phone] If unable to reach me by pager, please call my cell phone at  [phone]  *Please note that the hospitalists switch teams on Wednesdays. Please call the flow manager at [phone] you are having difficulty reaching the hospitalist taking care of this patient as she can update you and provide the most up-to-date pager number of provider caring for the patient. If 8PM-8AM, please contact night-coverage at [email or web address], password [email or web address] 02/02/2014, 7:44 AM    **Disclaimer: This note was dictated with voice recognition software. Similar sounding words can inadvertently be transcribed and this note may contain transcription errors which may not have been corrected upon publication of note.**

## 2014-02-03 ENCOUNTER — Inpatient Hospital Stay (HOSPITAL_COMMUNITY): Payer: Self-pay

## 2014-02-03 LAB — CBC
HEMATOCRIT: 49.5 % (ref 39.0–52.0)
Hemoglobin: 17.3 g/dL — ABNORMAL HIGH (ref 13.0–17.0)
MCH: 31.6 pg (ref 26.0–34.0)
MCHC: 34.9 g/dL (ref 30.0–36.0)
MCV: 90.5 fL (ref 78.0–100.0)
Platelets: 215 10*3/uL (ref 150–400)
RBC: 5.47 MIL/uL (ref 4.22–5.81)
RDW: 12.8 % (ref 11.5–15.5)
WBC: 9.8 10*3/uL (ref 4.0–10.5)

## 2014-02-03 LAB — WOUND CULTURE
Culture: NO GROWTH
Gram Stain: NONE SEEN
SPECIAL REQUESTS: NORMAL

## 2014-02-03 LAB — BASIC METABOLIC PANEL
BUN: 14 mg/dL (ref 6–23)
CHLORIDE: 98 meq/L (ref 96–112)
CO2: 27 mEq/L (ref 19–32)
Calcium: 9.3 mg/dL (ref 8.4–10.5)
Creatinine, Ser: 1.03 mg/dL (ref 0.50–1.35)
GFR calc Af Amer: >90 mL/min (ref >90)
Glucose, Bld: 87 mg/dL (ref 70–99)
Potassium: 4.3 mEq/L (ref 3.7–5.3)
SODIUM: 138 meq/L (ref 137–147)

## 2014-02-03 MED ORDER — CHLORHEXIDINE GLUCONATE 4 % EX LIQD
60.0000 mL | Freq: Once | CUTANEOUS | Status: DC
  Filled 2014-02-03: qty 60

## 2014-02-03 MED ORDER — GADOBENATE DIMEGLUMINE 529 MG/ML IV SOLN
15.0000 mL | Freq: Once | INTRAVENOUS | Status: AC | PRN
  Administered 2014-02-03: 15 mL via INTRAVENOUS

## 2014-02-03 MED ORDER — SODIUM CHLORIDE 0.9 % IV SOLN: INTRAVENOUS | Status: DC

## 2014-02-03 NOTE — Progress Notes (Addendum)
PROGRESS NOTE   Jason Duran:[id number] DOB: May 03, 1986 DOA: 02/01/2014 PCP: No primary provider on file.  Brief narrative: Jason Duran is an 28 y.o. male with no significant PMH who was admitted 02/01/14 with worsening left foot erythema, swelling and pain. Patient was treated as an outpatient for cellulitis with Bactrim and Keflex, and despite outpatient therapy, the patient's condition continued to worsen and he developed blistering.  Assessment/Plan: Principal Problem:   Sepsis secondary to cellulitis/soft tissue infection of left foot Patient met criteria for SIRS with an elevated heart rate and a WBC of 16.1. With left foot cellulitis, he meets the criteria for sepsis. He failed outpatient therapy with Bactrim and Keflex. Wound cultures were sent and the patient was placed on empiric vancomycin and Zosyn. ESR not elevated CRP 14.8. No current evidence of osteomyelitis based on CT scan/plain films done on admission.  Treat underlying onychomycosis.  Spoke with Dr. Erlinda Hong, who will see the patient in consultation since he has developed an abscess.  Will obtain an MRI to ensure no evidence of underlying osteomyelitis, and for operative planning.  WBC coming down on empiric antibiotics, hemodynamically stable and non-toxic appearing, so he is stable for surgery. Active Problems:   Leukocytosis Secondary to infection. WBC going down on broad-spectrum antibiotics.   Hyperkalemia Resolved with IVF.   Onychomycosis Lamisil cream BID ordered.   DVT Prophylaxis Continue Heparin.  Code Status: Full. Family Communication: Mother at bedside 02/02/14. Disposition Plan: Home when stable.   IV access:  Peripheral IV  Medical Consultants:  Dr. Erlinda Hong, Orthopedics  Other Consultants:  None  Anti-infectives:  Vancomycin 02/01/14--->  Zosyn 02/01/14--->   HPI/Subjective: Jason Duran is having foot pain.  He is extremely tender.  No nausea, vomiting, or diarrhea.     Objective: Filed Vitals:   02/02/14 0545 02/02/14 1400 02/02/14 2155 02/03/14 0500  BP: 126/91 143/83 149/71 121/64  Pulse: 84 62 60 64  Temp: 98.5 F (36.9 C) 98 F (36.7 C) 99.1 F (37.3 C) 97.2 F (36.2 C)  TempSrc: Oral Oral Oral Oral  Resp: '20 18 20 16  ' Height:      Weight:      SpO2: 97% 99% 100% 100%    Intake/Output Summary (Last 24 hours) at 02/03/14 1314 Last data filed at 02/03/14 0548  Gross per 24 hour  Intake 1373.75 ml  Output    775 ml  Net 598.75 ml    Exam: Gen:  NAD Cardiovascular:  RRR, No M/R/G Respiratory:  Lungs CTAB Gastrointestinal:  Abdomen soft, NT/ND, + BS Extremities:  Erythema and swelling to left foot as pictured below.  Nails thickened with skin changes consistent with dermatophyte infection. Left foot:  Purulent material/developing abscess with no significant change in erythema/swelling.  Data Reviewed: Basic Metabolic Panel:  Recent Labs Lab 02/01/14 1023 02/01/14 1420 02/02/14 0523 02/03/14 0420  NA 136* 135* 133* 138  K 5.7* 4.8 4.5 4.3  CL 104 98 95* 98  CO2  --  '24 25 27  ' GLUCOSE 104* 80 93 87  BUN 28* '17 17 14  ' CREATININE 1.20 1.00 1.16 1.03  CALCIUM  --  8.7 8.8 9.3  MG  --  2.0  --   --   PHOS  --  3.6  --   --    GFR Estimated Creatinine Clearance: 101.6 ml/min (by C-G formula based on Cr of 1.03). Liver Function Tests:  Recent Labs Lab 02/01/14 1420  AST 14  ALT 14  ALKPHOS 86  BILITOT 0.4  PROT 6.8  ALBUMIN 3.2*   CBC:  Recent Labs Lab 02/01/14 1015 02/01/14 1023 02/01/14 1420 02/02/14 0523 02/03/14 0420  WBC 16.1*  --  14.0* 14.4* 9.8  HGB 15.6 16.7 16.2 16.2 17.3*  HCT 44.7 49.0 46.0 45.6 49.5  MCV 89.2  --  89.5 88.7 90.5  PLT 179  --  154 183 215   Microbiology Recent Results (from the past 240 hour(s))  WOUND CULTURE     Status: None   Collection Time    02/01/14  9:48 AM      Result Value Ref Range Status   Specimen Description FOOT   Final   Special Requests Normal   Final    Gram Stain     Final   Value: NO WBC SEEN     RARE SQUAMOUS EPITHELIAL CELLS PRESENT     NO ORGANISMS SEEN     Performed at [facility]   Culture     Final   Value: NO GROWTH 2 DAYS     Performed at [facility]   Report Status 02/03/2014 FINAL   Final     Procedures and Diagnostic Studies: Ct Foot Left W Contrast  02/01/2014   CLINICAL DATA:  Left foot pain and swelling.  Suspected insect bite.  EXAM: CT OF THE LEFT FOOT WITH CONTRAST  TECHNIQUE: Multidetector CT imaging was performed following the standard protocol during bolus administration of intravenous contrast.  CONTRAST:  172m OMNIPAQUE IOHEXOL 300 MG/ML  SOLN  COMPARISON:  DG FOOT COMPLETE*L* dated 02/01/2014  FINDINGS: Considerable abnormal dorsal soft tissue swelling in the foot is. No drainable abscess. This tracks back to the lateral ankle.  No bony destructive findings characteristic of osteomyelitis. No significant arthropathy in the foot.  No malalignment at the Lisfranc joint.  IMPRESSION: Extensive dorsal subcutaneous edema in the foot tracking into the lateral ankle. No abscess or bony destructive findings characteristic of osteomyelitis noted.   Electronically Signed   By: WSherryl BartersM.D.   On: 02/01/2014 13:50   Dg Foot Complete Left  02/01/2014   CLINICAL DATA:  Redness and swelling overlying the lateral aspect of the foot.  EXAM: LEFT FOOT - COMPLETE 3+ VIEW  COMPARISON:  None.  FINDINGS: Normal anatomic alignment. No evidence for acute fracture or dislocation. Regional soft tissues demonstrate mild swelling about the forefoot.  IMPRESSION: Mild soft tissue swelling about the forefoot.  No evidence for underlying bony abnormality.   Electronically Signed   By: DLovey NewcomerM.D.   On: 02/01/2014 11:22    Scheduled Meds: . docusate sodium  100 mg Oral BID  . heparin  5,000 Units Subcutaneous 3 times per day  . piperacillin-tazobactam (ZOSYN)  IV  3.375 g Intravenous 3 times per day  . terbinafine    Topical BID  . vancomycin  1,000 mg Intravenous Q12H   Continuous Infusions: . sodium chloride 75 mL/hr at 02/02/14 2113    Time spent: 25 minutes.    LOS: 2 days   Osborn Pullin  [facility] Pager [phone] If unable to reach me by pager, please call my cell phone at [phone]  *Please note that the hospitalists switch teams on Wednesdays. Please call the flow manager at [phone] you are having difficulty reaching the hospitalist taking care of this patient as she can update you and provide the most up-to-date pager number of provider caring for the patient. If 8PM-8AM, please contact night-coverage at [email or web address], password  [email or web address]  02/03/2014, 1:14 PM    **Disclaimer: This note was dictated with voice recognition software. Similar sounding words can inadvertently be transcribed and this note may contain transcription errors which may not have been corrected upon publication of note.**

## 2014-02-03 NOTE — Treatment Plan (Signed)
Will see patient in the am and discuss treatment options and go over MRI  N. Glee ArvinMichael Paticia Moster, MD [facility] [phone] 6:59 PM

## 2014-02-04 ENCOUNTER — Inpatient Hospital Stay (HOSPITAL_COMMUNITY): Payer: Self-pay | Admitting: Anesthesiology

## 2014-02-04 ENCOUNTER — Encounter (HOSPITAL_COMMUNITY): Payer: Self-pay | Admitting: Anesthesiology

## 2014-02-04 ENCOUNTER — Encounter (HOSPITAL_COMMUNITY): Payer: Self-pay | Admitting: Registered Nurse

## 2014-02-04 ENCOUNTER — Encounter (HOSPITAL_COMMUNITY)
Admission: EM | Disposition: A | Discharge status: Home Health | Payer: Self-pay | Source: Home / Self Care | Attending: Internal Medicine

## 2014-02-04 HISTORY — PX: I&D EXTREMITY: SHX5045

## 2014-02-04 LAB — CBC
HCT: 46.1 % (ref 39.0–52.0)
HEMOGLOBIN: 15.8 g/dL (ref 13.0–17.0)
MCH: 31 pg (ref 26.0–34.0)
MCHC: 34.3 g/dL (ref 30.0–36.0)
MCV: 90.4 fL (ref 78.0–100.0)
PLATELETS: 227 10*3/uL (ref 150–400)
RBC: 5.1 MIL/uL (ref 4.22–5.81)
RDW: 12.7 % (ref 11.5–15.5)
WBC: 9.3 10*3/uL (ref 4.0–10.5)

## 2014-02-04 LAB — BASIC METABOLIC PANEL
BUN: 14 mg/dL (ref 6–23)
CO2: 29 mEq/L (ref 19–32)
Calcium: 9.7 mg/dL (ref 8.4–10.5)
Chloride: 99 mEq/L (ref 96–112)
Creatinine, Ser: 1.1 mg/dL (ref 0.50–1.35)
GFR calc Af Amer: >90 mL/min (ref >90)
GFR calc non Af Amer: >90 mL/min (ref >90)
GLUCOSE: 82 mg/dL (ref 70–99)
Potassium: 4.5 mEq/L (ref 3.7–5.3)
Sodium: 139 mEq/L (ref 137–147)

## 2014-02-04 LAB — SURGICAL PCR SCREEN
MRSA, PCR: NEGATIVE
Staphylococcus aureus: NEGATIVE

## 2014-02-04 LAB — VANCOMYCIN, TROUGH: Vancomycin Tr: 6.8 ug/mL — ABNORMAL LOW (ref 10.0–20.0)

## 2014-02-04 SURGERY — IRRIGATION AND DEBRIDEMENT EXTREMITY
Anesthesia: General | Laterality: Left

## 2014-02-04 MED ORDER — ONDANSETRON HCL 4 MG/2ML IJ SOLN: 4.0000 mg | Freq: Four times a day (QID) | INTRAMUSCULAR | Status: DC | PRN

## 2014-02-04 MED ORDER — FENTANYL CITRATE 0.05 MG/ML IJ SOLN
INTRAMUSCULAR | Status: AC
  Filled 2014-02-04: qty 5

## 2014-02-04 MED ORDER — FENTANYL CITRATE 0.05 MG/ML IJ SOLN
INTRAMUSCULAR | Status: DC | PRN
  Administered 2014-02-04 (×3): 50 ug via INTRAVENOUS
  Administered 2014-02-04: 25 ug via INTRAVENOUS

## 2014-02-04 MED ORDER — OXYCODONE-ACETAMINOPHEN 5-325 MG PO TABS
2.0000 | ORAL_TABLET | ORAL | Status: DC | PRN
  Administered 2014-02-04 – 2014-02-05 (×5): 2 via ORAL
  Filled 2014-02-04 (×5): qty 2

## 2014-02-04 MED ORDER — ONDANSETRON HCL 4 MG/2ML IJ SOLN
INTRAMUSCULAR | Status: DC | PRN
  Administered 2014-02-04: 4 mg via INTRAVENOUS

## 2014-02-04 MED ORDER — DEXAMETHASONE SODIUM PHOSPHATE 10 MG/ML IJ SOLN
INTRAMUSCULAR | Status: DC | PRN
  Administered 2014-02-04: 10 mg via INTRAVENOUS

## 2014-02-04 MED ORDER — MIDAZOLAM HCL 2 MG/2ML IJ SOLN
INTRAMUSCULAR | Status: AC
  Filled 2014-02-04: qty 2

## 2014-02-04 MED ORDER — MORPHINE SULFATE 2 MG/ML IJ SOLN
1.0000 mg | INTRAMUSCULAR | Status: DC | PRN
  Administered 2014-02-05 – 2014-02-06 (×3): 1 mg via INTRAVENOUS
  Filled 2014-02-04 (×3): qty 1

## 2014-02-04 MED ORDER — LACTATED RINGERS IV SOLN: INTRAVENOUS | Status: DC

## 2014-02-04 MED ORDER — ACETAMINOPHEN 10 MG/ML IV SOLN
INTRAVENOUS | Status: DC | PRN
  Administered 2014-02-04: 1000 mg via INTRAVENOUS

## 2014-02-04 MED ORDER — ONDANSETRON HCL 4 MG PO TABS
4.0000 mg | ORAL_TABLET | Freq: Four times a day (QID) | ORAL | Status: DC | PRN
  Administered 2014-02-05: 4 mg via ORAL

## 2014-02-04 MED ORDER — SODIUM CHLORIDE 0.9 % IV SOLN
INTRAVENOUS | Status: DC
  Administered 2014-02-04 – 2014-02-07 (×2): via INTRAVENOUS
  Administered 2014-02-08: 250 mL via INTRAVENOUS

## 2014-02-04 MED ORDER — DIPHENHYDRAMINE HCL 12.5 MG/5ML PO ELIX
25.0000 mg | ORAL_SOLUTION | ORAL | Status: DC | PRN
  Administered 2014-02-05: 25 mg via ORAL
  Filled 2014-02-04: qty 10

## 2014-02-04 MED ORDER — LACTATED RINGERS IV SOLN
INTRAVENOUS | Status: DC | PRN
  Administered 2014-02-04: 20:00:00 via INTRAVENOUS

## 2014-02-04 MED ORDER — OXYCODONE HCL 5 MG PO TABS
5.0000 mg | ORAL_TABLET | ORAL | Status: DC | PRN
  Administered 2014-02-05 – 2014-02-06 (×6): 10 mg via ORAL
  Filled 2014-02-04 (×6): qty 2

## 2014-02-04 MED ORDER — PROPOFOL 10 MG/ML IV BOLUS
INTRAVENOUS | Status: DC | PRN
  Administered 2014-02-04: 200 mg via INTRAVENOUS

## 2014-02-04 MED ORDER — FENTANYL CITRATE 0.05 MG/ML IJ SOLN
25.0000 ug | INTRAMUSCULAR | Status: DC | PRN
  Administered 2014-02-04 (×4): 50 ug via INTRAVENOUS

## 2014-02-04 MED ORDER — DEXAMETHASONE SODIUM PHOSPHATE 10 MG/ML IJ SOLN
INTRAMUSCULAR | Status: AC
  Filled 2014-02-04: qty 1

## 2014-02-04 MED ORDER — FENTANYL CITRATE 0.05 MG/ML IJ SOLN
INTRAMUSCULAR | Status: AC
  Filled 2014-02-04: qty 2

## 2014-02-04 MED ORDER — VANCOMYCIN HCL IN DEXTROSE 1-5 GM/200ML-% IV SOLN
1000.0000 mg | Freq: Three times a day (TID) | INTRAVENOUS | Status: DC
  Administered 2014-02-05 – 2014-02-10 (×18): 1000 mg via INTRAVENOUS
  Filled 2014-02-04 (×20): qty 200

## 2014-02-04 MED ORDER — LIDOCAINE HCL (CARDIAC) 20 MG/ML IV SOLN
INTRAVENOUS | Status: AC
  Filled 2014-02-04: qty 5

## 2014-02-04 MED ORDER — MIDAZOLAM HCL 5 MG/5ML IJ SOLN
INTRAMUSCULAR | Status: DC | PRN
  Administered 2014-02-04 (×2): 1 mg via INTRAVENOUS

## 2014-02-04 MED ORDER — METOCLOPRAMIDE HCL 5 MG/ML IJ SOLN
5.0000 mg | Freq: Three times a day (TID) | INTRAMUSCULAR | Status: DC | PRN
  Filled 2014-02-04: qty 2

## 2014-02-04 MED ORDER — VANCOMYCIN HCL 10 G IV SOLR
1500.0000 mg | Freq: Once | INTRAVENOUS | Status: AC
  Administered 2014-02-04: 1500 mg via INTRAVENOUS
  Filled 2014-02-04: qty 1500

## 2014-02-04 MED ORDER — LIDOCAINE HCL (CARDIAC) 20 MG/ML IV SOLN
INTRAVENOUS | Status: DC | PRN
  Administered 2014-02-04: 100 mg via INTRAVENOUS

## 2014-02-04 MED ORDER — PROPOFOL 10 MG/ML IV BOLUS
INTRAVENOUS | Status: AC
  Filled 2014-02-04: qty 20

## 2014-02-04 MED ORDER — ONDANSETRON HCL 4 MG/2ML IJ SOLN
INTRAMUSCULAR | Status: AC
  Filled 2014-02-04: qty 2

## 2014-02-04 MED ORDER — HYDROCODONE-ACETAMINOPHEN 5-325 MG PO TABS
1.0000 | ORAL_TABLET | ORAL | Status: DC | PRN
  Administered 2014-02-08 – 2014-02-09 (×7): 2 via ORAL
  Filled 2014-02-04 (×8): qty 2

## 2014-02-04 MED ORDER — METOCLOPRAMIDE HCL 10 MG PO TABS: 5.0000 mg | ORAL_TABLET | Freq: Three times a day (TID) | ORAL | Status: DC | PRN

## 2014-02-04 SURGICAL SUPPLY — 32 items
BAG ZIPLOCK 12X15 (MISCELLANEOUS) ×3 IMPLANT
BANDAGE ESMARK 6X9 LF (GAUZE/BANDAGES/DRESSINGS) ×1 IMPLANT
BNDG ESMARK 6X9 LF (GAUZE/BANDAGES/DRESSINGS) ×3
BNDG GAUZE ELAST 4 BULKY (GAUZE/BANDAGES/DRESSINGS) IMPLANT
CUFF TOURN SGL QUICK 18 (TOURNIQUET CUFF) IMPLANT
CUFF TOURN SGL QUICK 24 (TOURNIQUET CUFF)
CUFF TOURN SGL QUICK 34 (TOURNIQUET CUFF)
CUFF TRNQT CYL 24X4X40X1 (TOURNIQUET CUFF) IMPLANT
CUFF TRNQT CYL 34X4X40X1 (TOURNIQUET CUFF) IMPLANT
DRAIN PENROSE 18X1/2 LTX STRL (DRAIN) IMPLANT
DRSG PAD ABDOMINAL 8X10 ST (GAUZE/BANDAGES/DRESSINGS) IMPLANT
DRSG VAC ATS SM SENSATRAC (GAUZE/BANDAGES/DRESSINGS) ×3 IMPLANT
DURAPREP 26ML APPLICATOR (WOUND CARE) ×3 IMPLANT
ELECT REM PT RETURN 9FT ADLT (ELECTROSURGICAL) ×3
ELECTRODE REM PT RTRN 9FT ADLT (ELECTROSURGICAL) ×1 IMPLANT
GLOVE BIO SURGEON STRL SZ7.5 (GLOVE) ×3 IMPLANT
GLOVE BIOGEL PI IND STRL 8 (GLOVE) ×1 IMPLANT
GLOVE BIOGEL PI INDICATOR 8 (GLOVE) ×2
GLOVE ECLIPSE 8.0 STRL XLNG CF (GLOVE) ×3 IMPLANT
GOWN STRL REUS W/TWL XL LVL3 (GOWN DISPOSABLE) ×3 IMPLANT
HANDPIECE INTERPULSE COAX TIP (DISPOSABLE)
KIT BASIN OR (CUSTOM PROCEDURE TRAY) ×3 IMPLANT
PACK LOWER EXTREMITY WL (CUSTOM PROCEDURE TRAY) ×3 IMPLANT
PAD CAST 4YDX4 CTTN HI CHSV (CAST SUPPLIES) ×1 IMPLANT
PADDING CAST COTTON 4X4 STRL (CAST SUPPLIES) ×2
POSITIONER SURGICAL ARM (MISCELLANEOUS) IMPLANT
SET HNDPC FAN SPRY TIP SCT (DISPOSABLE) IMPLANT
SET IRRIG Y TYPE TUR BLADDER L (SET/KITS/TRAYS/PACK) ×3 IMPLANT
SPONGE GAUZE 4X4 12PLY (GAUZE/BANDAGES/DRESSINGS) IMPLANT
SPONGE LAP 18X18 X RAY DECT (DISPOSABLE) ×6 IMPLANT
SYR CONTROL 10ML LL (SYRINGE) ×3 IMPLANT
TOWEL OR 17X26 10 PK STRL BLUE (TOWEL DISPOSABLE) ×6 IMPLANT

## 2014-02-04 NOTE — Progress Notes (Signed)
ANTIBIOTIC CONSULT NOTE - Follow Up  Pharmacy Consult for vancomycin, piperacillin/tazobactam Indication: LE cellulitis  No Known Allergies  Patient Measurements: Height: 5\' 7"  (170.2 cm) Weight: 170 lb (77.111 kg) IBW/kg (Calculated) : 66.1 Adjusted Body Weight:   Vital Signs: Temp: 98.7 F (37.1 C) (03/31 0700) Temp src: Oral (03/31 0700) BP: 145/87 mmHg (03/31 0700) Pulse Rate: 65 (03/31 0700) Intake/Output from previous day: 03/30 0701 - 03/31 0700 In: 1215 [I.V.:1215] Out: -  Intake/Output from this shift:    Labs:  Recent Labs  02/02/14 0523 02/03/14 0420 02/04/14 0441  WBC 14.4* 9.8 9.3  HGB 16.2 17.3* 15.8  PLT 183 215 227  CREATININE 1.16 1.03 1.10   Estimated Creatinine Clearance: 95.1 ml/min (by C-G formula based on Cr of 1.1). No results found for this basename: VANCOTROUGH, Leodis BinetVANCOPEAK, VANCORANDOM, GENTTROUGH, GENTPEAK, GENTRANDOM, TOBRATROUGH, TOBRAPEAK, TOBRARND, AMIKACINPEAK, AMIKACINTROU, AMIKACIN,  in the last 72 hours   Microbiology: Recent Results (from the past 720 hour(s))  WOUND CULTURE     Status: None   Collection Time    02/01/14  9:48 AM      Result Value Ref Range Status   Specimen Description FOOT   Final   Special Requests Normal   Final   Gram Stain     Final   Value: NO WBC SEEN     RARE SQUAMOUS EPITHELIAL CELLS PRESENT     NO ORGANISMS SEEN     [facility]   Culture     Final   Value: NO GROWTH 2 DAYS     [facility]   Report Status 02/03/2014 FINAL   Final    Medical History: History reviewed. No pertinent past medical history.  Assessment: 27 YOM failing outpatient cephalexin and TMP/SMZ presents with L LE erythema, pain and swelling. Orders to start broad spectrum abx.  3/28 >> vancomycin  >> 3/28 >>zosyn >>    Tmax: afeb WBCs: improved to WNL Renal: SCr = 1.1, stable for est CrCl = 395ml/min (C-G)   3/28: wound cx: NGTD  Goal of Therapy:  Vancomycin trough level  10-15 mcg/ml  Plan:  1) Continue Vancomycin 1gm IV q12h   Check vancomycin trough tonight prior to 8pm dose 2) Continue Zosyn 3.375gm IV q8h with 4h infusion   Hessie KnowsJustin M Shyasia Funches, PharmD, BCPS Pager [phone] 02/04/2014 10:42 AM

## 2014-02-04 NOTE — H&P (Signed)
H&P update  The surgical history has been reviewed and remains accurate without interval change.  The patient was re-examined and patient's physiologic condition has not changed significantly in the last 30 days. The condition still exists that makes this procedure necessary. The treatment plan remains the same, without new options for care.  No new pharmacological allergies or types of therapy has been initiated that would change the plan or the appropriateness of the plan.  The patient and/or family understand the potential benefits and risks.  Mayra ReelN. Michael Xu, MD 02/04/2014 7:53 AM

## 2014-02-04 NOTE — Progress Notes (Signed)
Brief Pharmacy update:  Please see clinical pharmacy note written earlier today for full details.     D4 Vancomycin and Zosyn for cellulitis.   Current Vancomycin dose = 1g IV q12h Vancomycin trough tonight = 6.8, subtherapeutic.  (Goal 10-15)  Plan: Reload with Vancomycin 1500mg  IV x 1, then increase vancomycin dose to 1g IV q8h.    Haynes Hoehnolleen Arjuna Doeden, PharmD, BCPS 02/04/2014, 8:01 PM  Pager: [phone]

## 2014-02-04 NOTE — Preoperative (Signed)
Beta Blockers   Reason not to administer Beta Blockers:Not Applicable 

## 2014-02-04 NOTE — Progress Notes (Signed)
   CARE MANAGEMENT NOTE 02/04/2014  Patient:  Jason Duran,Jason Duran   Account Number:  [id number]  Date Initiated:  02/04/2014  Documentation initiated by:  [facility],Ameya Vowell  Subjective/Objective Assessment:   Sepsis secondary to cellulitis/soft tissue infection of left foot     Action/Plan:   lives at home with girlfriend, Orma RenderKatrina Morton   Anticipated DC Date:  02/07/2014   Anticipated DC Plan:  HOME/SELF CARE      DC Planning Services  CM consult  Medication Assistance  MATCH Program      Choice offered to / List presented to:             Status of service:  Completed, signed off Medicare Important Message given?   (If response is "NO", the following Medicare IM given date fields will be blank) Date Medicare IM given:   Date Additional Medicare IM given:    Discharge Disposition:  HOME/SELF CARE  Per UR Regulation:    If discussed at Long Length of Stay Meetings, dates discussed:    Comments:  02/04/2014 1330 NCM spoke to pt and states he has no drug coverage. Will need PCP and assistance with medications. Will use MATCH for medications at dc. Waiting final recommendations for home. [facility]. Isidoro DonningAlesia Sueellen Kayes RN CCM Case Mgmt phone [phone]

## 2014-02-04 NOTE — Anesthesia Postprocedure Evaluation (Signed)
  Anesthesia Post-op Note  Patient: Jason Duran  Procedure(s) Performed: Procedure(s) (LRB): IRRIGATION AND DEBRIDEMENT LEFT FOOT ABSCESS (Left)  Patient Location: PACU  Anesthesia Type: General  Level of Consciousness: awake and alert   Airway and Oxygen Therapy: Patient Spontanous Breathing  Post-op Pain: mild  Post-op Assessment: Post-op Vital signs reviewed, Patient's Cardiovascular Status Stable, Respiratory Function Stable, Patent Airway and No signs of Nausea or vomiting  Last Vitals:  Filed Vitals:   02/04/14 2200  BP: 172/98  Pulse: 60  Temp: 36.8 C  Resp: 13    Post-op Vital Signs: stable   Complications: No apparent anesthesia complications

## 2014-02-04 NOTE — Anesthesia Preprocedure Evaluation (Addendum)
Anesthesia Evaluation  Patient identified by MRN, date of birth, ID band Patient awake    Reviewed: Allergy & Precautions, H&P , NPO status , Patient's Chart, lab work & pertinent test results  Airway Mallampati: II TM Distance: >3 FB Neck ROM: full    Dental no notable dental hx.    Pulmonary neg pulmonary ROS, Current Smoker,  breath sounds clear to auscultation  Pulmonary exam normal       Cardiovascular Exercise Tolerance: Good negative cardio ROS  Rhythm:regular Rate:Normal     Neuro/Psych negative neurological ROS  negative psych ROS   GI/Hepatic negative GI ROS, Neg liver ROS,   Endo/Other  negative endocrine ROS  Renal/GU negative Renal ROS  negative genitourinary   Musculoskeletal   Abdominal   Peds  Hematology negative hematology ROS (+)   Anesthesia Other Findings   Reproductive/Obstetrics negative OB ROS                          Anesthesia Physical Anesthesia Plan  ASA: I  Anesthesia Plan: General   Post-op Pain Management:    Induction: Intravenous  Airway Management Planned: LMA  Additional Equipment:   Intra-op Plan:   Post-operative Plan:   Informed Consent: I have reviewed the patients History and Physical, chart, labs and discussed the procedure including the risks, benefits and alternatives for the proposed anesthesia with the patient or authorized representative who has indicated his/her understanding and acceptance.   Dental Advisory Given  Plan Discussed with: CRNA and Surgeon  Anesthesia Plan Comments:         Anesthesia Quick Evaluation

## 2014-02-04 NOTE — Consult Note (Signed)
   ORTHOPAEDIC CONSULTATION  REQUESTING PHYSICIAN: Maryruth Bunhristina P Rama, MD  Chief Complaint: Left foot swelling, infection  HPI: Jason Duran is a 28 y.o. male who complains of left foot swelling and infection.  Denies trauma to foot.  Had worsening pain, swelling, erythema after being on bactrim and keflex as an outpatient.  Denies DM.  History reviewed. No pertinent past medical history. History reviewed. No pertinent past surgical history. History   Social History  . Marital Status: Single    Spouse Name: N/A    Number of Children: N/A  . Years of Education: N/A   Social History Main Topics  . Smoking status: Current Every Day Smoker -- 0.25 packs/day for 8 years    Types: Cigarettes  . Smokeless tobacco: Never Used  . Alcohol Use: No  . Drug Use: Yes    Special: Marijuana  . Sexual Activity: None   Other Topics Concern  . None   Social History Narrative  . None   History reviewed. No pertinent family history. No Known Allergies Prior to Admission medications   Medication Sig Start Date End Date Taking? Authorizing Provider  acetaminophen (TYLENOL) 500 MG tablet Take 1,000 mg by mouth every 6 (six) hours as needed for headache.   Yes Historical Provider, MD  cephALEXin (KEFLEX) 500 MG capsule Take 1 capsule (500 mg total) by mouth 4 (four) times daily. 01/31/14  Yes Derwood KaplanAnkit Nanavati, MD  HYDROcodone-acetaminophen (NORCO/VICODIN) 5-325 MG per tablet Take 1 tablet by mouth every 6 (six) hours as needed. 01/31/14  Yes Derwood KaplanAnkit Nanavati, MD  ibuprofen (ADVIL,MOTRIN) 600 MG tablet Take 1 tablet (600 mg total) by mouth every 6 (six) hours as needed. 01/31/14  Yes Derwood KaplanAnkit Nanavati, MD  sulfamethoxazole-trimethoprim (SEPTRA DS) 800-160 MG per tablet Take 1 tablet by mouth 2 (two) times daily. 01/31/14  Yes Derwood KaplanAnkit Nanavati, MD  tetrahydrozoline 0.05 % ophthalmic solution Place 1 drop into both eyes as needed (dry eyes).   Yes Historical Provider, MD   No results found.  Positive ROS:  All other systems have been reviewed and were otherwise negative with the exception of those mentioned in the HPI and as above.  Physical Exam: General: Alert, no acute distress Cardiovascular: No pedal edema Respiratory: No cyanosis, no use of accessory musculature GI: No organomegaly, abdomen is soft and non-tender Skin: No lesions in the area of chief complaint Neurologic: Sensation intact distally Psychiatric: Patient is competent for consent with normal mood and affect Lymphatic: No axillary or cervical lymphadenopathy  MUSCULOSKELETAL:  Left foot - dorsal fluctuance with blistering of skin - very tender to touch - foot wwp - cellulitis - clinically c/w abscess  Assessment: Left foot abscess  Plan: - MRI reviewed and c/w abscess, will await final interpretation by radiologist - NPO - plan for surgery this pm - discussed r/b/a and the possible need for additional surgeries - continue IV abx  Thank you for the consult and the opportunity to see Mr. Legrand PittsMiller  N. Glee ArvinMichael Alfonza Toft, MD [facility] [phone] 7:29 AM

## 2014-02-04 NOTE — Op Note (Signed)
   Date of Surgery: 02/04/2014  INDICATIONS: Mr. Jason Duran is a 28 y.o.-year-old male who developed a left foot abscess from a boil on his left 4th toe and failed po antibiotics ;  The patient did consent to the procedure after discussion of the risks and benefits.  PREOPERATIVE DIAGNOSIS: Left foot abscess  POSTOPERATIVE DIAGNOSIS: Same.  PROCEDURE:  1. Incision and drainage of left foot abscess 2. Application of negative wound therapy less than 50 cm  SURGEON: N. Glee ArvinMichael Xu, M.D.  ASSIST: None.  ANESTHESIA:  general  IV FLUIDS AND URINE: See anesthesia.  ESTIMATED BLOOD LOSS: minimal mL.  IMPLANTS: None  COMPLICATIONS: None.  DESCRIPTION OF PROCEDURE: The patient was brought to the operating room and placed supine on the operating table.  The patient had been signed prior to the procedure and this was documented. The patient had the anesthesia placed by the anesthesiologist.  A time-out was performed to confirm that this was the correct patient, site, side and location. The patient had an SCD on the opposite lower extremity. The patient did receive antibiotics prior to the incision due to the sepsis protocol.  The patient had the operative extremity prepped and draped in the standard surgical fashion.    A calf Esmarch tourniquet was placed. An incision was made directly over the site of the dorsal abscess where it appeared to be ready to erupt.  There was return of a large amount of frank pus. This was cultured. Once this was done sharp excisional debridement of dead skin and muscle was carried out.  The abscess was circumferentially decompressed using a tonsil. Once I felt that there was adequate decompression of the abscess. I irrigated 6 L of normal saline through the wound using cystoscopy tubing. Careful inspection of the skin showed that the patient could potentially have a large skin defect over the dorsum of his foot. The decision was made to place a wound VAC and have the patient  return in approximately 48 hours to inspect the skin for survival. The wound VAC was placed and set to -125 mm mercury. The patient awoke from anesthesia uneventfully and was transferred to the PACU in stable condition  POSTOPERATIVE PLAN: The patient will be nonweightbearing to the left lower extremity. He will return in approximately 48 hours for repeat irrigation and debridement and inspection of the skin. I expect that some of the skin will declare itself and die.  Ultimately I feel that this patient will have a soft tissue defect that will require skin grafting plus or minus Integra.  We will follow the intraoperative cultures for speciation to direct antibiotic therapy.    Mayra ReelN. Michael Xu, MD [facility] [phone] 9:01 PM

## 2014-02-04 NOTE — Transfer of Care (Signed)
Immediate Anesthesia Transfer of Care Note  Patient: Jason Duran  Procedure(s) Performed: Procedure(s): IRRIGATION AND DEBRIDEMENT LEFT FOOT ABSCESS (Left)  Patient Location: PACU  Anesthesia Type:General  Level of Consciousness: awake, alert , oriented and patient cooperative  Airway & Oxygen Therapy: Patient Spontanous Breathing and Patient connected to face mask oxygen  Post-op Assessment: Report given to PACU RN, Post -op Vital signs reviewed and stable and Patient moving all extremities  Post vital signs: Reviewed and stable  Complications: No apparent anesthesia complications

## 2014-02-04 NOTE — Progress Notes (Signed)
PROGRESS NOTE   Jason Duran OBS:[id number] DOB: May 03, 1986 DOA: 02/01/2014 PCP: No primary provider on file.  Brief narrative: Jason Duran is an 28 y.o. male with no significant PMH who was admitted 02/01/14 with worsening left foot erythema, swelling and pain. Patient was treated as an outpatient for cellulitis with Bactrim and Keflex, and despite outpatient therapy, the patient's condition continued to worsen and he developed blistering.  Ortho consulted with plans to take to OR later today.  Assessment/Plan: Principal Problem:   Sepsis secondary to cellulitis/soft tissue infection of left foot Patient met criteria for SIRS with an elevated heart rate and a WBC of 16.1. With left foot cellulitis, he meets the criteria for sepsis. He failed outpatient therapy with Bactrim and Keflex. Wound cultures were sent and the patient was placed on empiric vancomycin and Zosyn. ESR not elevated CRP 14.8. No current evidence of osteomyelitis based on CT scan/plain films done on admission.  Treat underlying onychomycosis.  Spoke with Dr. Erlinda Hong, who will see the patient in consultation since he has developed an abscess.  MRI negative for underlying osteomyelitis.  WBC coming down on empiric antibiotics, hemodynamically stable and non-toxic appearing, so he is stable for surgery, which is planned for later today. Active Problems:   Leukocytosis Secondary to infection. WBC normalized on broad-spectrum antibiotics.   Hyperkalemia Resolved with IVF.   Onychomycosis Lamisil cream BID ordered.   DVT Prophylaxis Continue Heparin.  Code Status: Full. Family Communication: Mother at bedside 02/02/14. Disposition Plan: Home when stable.   IV access:  Peripheral IV  Medical Consultants:  Dr. Erlinda Hong, Orthopedics  Other Consultants:  None  Anti-infectives:  Vancomycin 02/01/14--->  Zosyn 02/01/14--->   HPI/Subjective: Jason Duran is having severe foot pain.  He is extremely tender.  No  nausea, vomiting, or diarrhea.    Objective: Filed Vitals:   02/03/14 1353 02/03/14 2208 02/04/14 0535 02/04/14 0700  BP: 147/87 150/79 131/76 145/87  Pulse: 79 66 62 65  Temp: 98.3 F (36.8 C) 98.4 F (36.9 C) 98.5 F (36.9 C) 98.7 F (37.1 C)  TempSrc:  Oral Oral Oral  Resp: _0 Height:      Weight:      SpO2: 98% 97% 99% 97%    Intake/Output Summary (Last 24 hours) at 02/04/14 1106 Last data filed at 02/03/14 2200  Gross per 24 hour  Intake   1215 ml  Output      0 ml  Net   1215 ml    Exam: Gen:  NAD Cardiovascular:  RRR, No M/R/G Respiratory:  Lungs CTAB Gastrointestinal:  Abdomen soft, NT/ND, + BS Extremities:  Erythema and swelling to left foot as pictured below.  Nails thickened with skin changes consistent with dermatophyte infection. Left foot:  Purulent material/developing abscess with no significant change in erythema/swelling. Blister now apparent.  Data Reviewed: Basic Metabolic Panel:  Recent Labs Lab 02/01/14 1023 02/01/14 1420 02/02/14 0523 02/03/14 0420 02/04/14 0441  NA 136* 135* 133* 138 139  K 5.7* 4.8 4.5 4.3 4.5  CL 104 98 95* 98 99  CO2  --  _1 GLUCOSE 104* 80 93 87 82  BUN 28* _2 CREATININE 1.20 1.00 1.16 1.03 1.10  CALCIUM  --  8.7 8.8 9.3 9.7  MG  --  2.0  --   --   --   PHOS  --  3.6  --   --   --  GFR Estimated Creatinine Clearance: 95.1 ml/min (by C-G formula based on Cr of 1.1). Liver Function Tests:  Recent Labs Lab 02/01/14 1420  AST 14  ALT 14  ALKPHOS 86  BILITOT 0.4  PROT 6.8  ALBUMIN 3.2*   CBC:  Recent Labs Lab 02/01/14 1015 02/01/14 1023 02/01/14 1420 02/02/14 0523 02/03/14 0420 02/04/14 0441  WBC 16.1*  --  14.0* 14.4* 9.8 9.3  HGB 15.6 16.7 16.2 16.2 17.3* 15.8  HCT 44.7 49.0 46.0 45.6 49.5 46.1  MCV 89.2  --  89.5 88.7 90.5 90.4  PLT 179  --  154 183 215 227   Microbiology Recent Results (from the past 240 hour(s))  WOUND CULTURE     Status: None    Collection Time    02/01/14  9:48 AM      Result Value Ref Range Status   Specimen Description FOOT   Final   Special Requests Normal   Final   Gram Stain     Final   Value: NO WBC SEEN     RARE SQUAMOUS EPITHELIAL CELLS PRESENT     NO ORGANISMS SEEN     Performed at [facility]   Culture     Final   Value: NO GROWTH 2 DAYS     Performed at [facility]   Report Status 02/03/2014 FINAL   Final     Procedures and Diagnostic Studies: Ct Foot Left W Contrast  02/01/2014   CLINICAL DATA:  Left foot pain and swelling.  Suspected insect bite.  EXAM: CT OF THE LEFT FOOT WITH CONTRAST  TECHNIQUE: Multidetector CT imaging was performed following the standard protocol during bolus administration of intravenous contrast.  CONTRAST:  OMNIPAQUE IOHEXOL 300 MG/ML  SOLN  COMPARISON:  DG FOOT COMPLETE*L* dated 02/01/2014  FINDINGS: Considerable abnormal dorsal soft tissue swelling in the foot is. No drainable abscess. This tracks back to the lateral ankle.  No bony destructive findings characteristic of osteomyelitis. No significant arthropathy in the foot.  No malalignment at the Lisfranc joint.  IMPRESSION: Extensive dorsal subcutaneous edema in the foot tracking into the lateral ankle. No abscess or bony destructive findings characteristic of osteomyelitis noted.   Electronically Signed   By: Herbie Baltimore M.D.   On: 02/01/2014 13:50   Dg Foot Complete Left  02/01/2014   CLINICAL DATA:  Redness and swelling overlying the lateral aspect of the foot.  EXAM: LEFT FOOT - COMPLETE 3+ VIEW  COMPARISON:  None.  FINDINGS: Normal anatomic alignment. No evidence for acute fracture or dislocation. Regional soft tissues demonstrate mild swelling about the forefoot.  IMPRESSION: Mild soft tissue swelling about the forefoot.  No evidence for underlying bony abnormality.   Electronically Signed   By: Annia Belt M.D.   On: 02/01/2014 11:22    Scheduled Meds: . chlorhexidine  60 mL Topical Once   . docusate sodium  100 mg Oral BID  . heparin  5,000 Units Subcutaneous 3 times per day  . piperacillin-tazobactam (ZOSYN)  IV  3.375 g Intravenous 3 times per day  . terbinafine   Topical BID  . vancomycin  1,000 mg Intravenous Q12H   Continuous Infusions: . sodium chloride 75 mL/hr at 02/04/14 0752  . sodium chloride      Time spent: 25 minutes.    LOS: 3 days   Johnluke Haugen  [facility] Pager [phone]. If unable to reach me by pager, please call my cell phone at [phone].  *Please note that the  hospitalists switch teams on Wednesdays. Please call the flow manager at [phone] if you are having difficulty reaching the hospitalist taking care of this patient as she can update you and provide the most up-to-date pager number of provider caring for the patient. If 8PM-8AM, please contact night-coverage at [email or web address], password [facility]  02/04/2014, 11:06 AM    **Disclaimer: This note was dictated with voice recognition software. Similar sounding words can inadvertently be transcribed and this note may contain transcription errors which may not have been corrected upon publication of note.**

## 2014-02-05 ENCOUNTER — Encounter (HOSPITAL_COMMUNITY): Payer: Self-pay | Admitting: Orthopaedic Surgery

## 2014-02-05 LAB — C-REACTIVE PROTEIN: CRP: 6.8 mg/dL — ABNORMAL HIGH

## 2014-02-05 MED ORDER — VANCOMYCIN HCL IN DEXTROSE 1-5 GM/200ML-% IV SOLN: 1000.0000 mg | INTRAVENOUS | Status: DC

## 2014-02-05 MED ORDER — SODIUM CHLORIDE 0.9 % IV SOLN
INTRAVENOUS | Status: DC
  Administered 2014-02-05 – 2014-02-07 (×4): via INTRAVENOUS

## 2014-02-05 MED ORDER — CHLORHEXIDINE GLUCONATE 4 % EX LIQD
60.0000 mL | Freq: Once | CUTANEOUS | Status: DC
  Filled 2014-02-05: qty 60

## 2014-02-05 NOTE — Progress Notes (Signed)
CSW consulted. PN reviewed. RNCM is assisting with d/c planning and medication assistance. Notes indicate pt plans to return home once stable.  Cori RazorJamie Bessie Livingood LCSW [phone]

## 2014-02-05 NOTE — Progress Notes (Signed)
PROGRESS NOTE   Jason Duran OZH:[id number] DOB: 04-12-1986 DOA: 02/01/2014 PCP: No primary provider on file.  Brief narrative: Jason Duran is an 28 y.o. male with no significant PMH who was admitted 02/01/14 with worsening left foot erythema, swelling and pain. Patient was treated as an outpatient for cellulitis with Bactrim and Keflex, and despite outpatient therapy, the patient's condition continued to worsen and he developed blistering.  Ortho consulted and he was taken to OR last night.  Assessment/Plan: Principal Problem:   Sepsis secondary to cellulitis/soft tissue infection of left foot Patient met criteria for SIRS with an elevated heart rate and a WBC of 16.1. With left foot cellulitis, he meets the criteria for sepsis. He failed outpatient therapy with Bactrim and Keflex. Wound cultures were sent and the patient was placed on empiric vancomycin and Zosyn. ESR not elevated CRP 14.8. No current evidence of osteomyelitis based on CT scan/plain films done on admission.  Treat underlying onychomycosis.  Spoke with Dr. Erlinda Hong, who will see the patient in consultation since he has developed an abscess.  MRI negative for underlying osteomyelitis.  WBC coming down on empiric antibiotics, hemodynamically stable and non-toxic appearing, so he is stable for surgery, which is planned for later today.    Leukocytosis Secondary to infection. WBC normalized on broad-spectrum antibiotics.   Hyperkalemia Resolved with IVF.   Onychomycosis Lamisil cream BID ordered.   DVT Prophylaxis Continue Heparin.  Code Status: Full. Family Communication: Mother at bedside 02/02/14. Disposition Plan: Home when stable.   IV access:  Peripheral IV  Medical Consultants:  Dr. Erlinda Hong, Orthopedics  Other Consultants:  None  Anti-infectives:  Vancomycin 02/01/14--->  Zosyn 02/01/14--->   HPI/Subjective: Jason Duran feels better today than yesterday. Pain is better controlled than yesterday.    Objective: Filed Vitals:   02/04/14 2325 02/05/14 0049 02/05/14 0126 02/05/14 0541  BP: 128/79 154/86 156/84 137/87  Pulse: 74 69 55 56  Temp: 98.5 F (36.9 C) 98.5 F (36.9 C) 98.4 F (36.9 C) 97.9 F (36.6 C)  TempSrc: Oral  Oral Oral  Resp: '14 16 14 14  ' Height:      Weight:      SpO2: 97% 99% 95% 97%    Intake/Output Summary (Last 24 hours) at 02/05/14 1433 Last data filed at 02/05/14 0910  Gross per 24 hour  Intake 2446.25 ml  Output   2150 ml  Net 296.25 ml    Exam: Gen:  NAD Cardiovascular:  RRR, No M/R/G Respiratory:  Lungs CTAB Gastrointestinal:  Abdomen soft, NT/ND, + BS Extremities:  Erythema and swelling to left foot as pictured below.  Nails thickened with skin changes consistent with dermatophyte infection. Left foot: wound vac placed on .   Data Reviewed: Basic Metabolic Panel:  Recent Labs Lab 02/01/14 1023 02/01/14 1420 02/02/14 0523 02/03/14 0420 02/04/14 0441  NA 136* 135* 133* 138 139  K 5.7* 4.8 4.5 4.3 4.5  CL 104 98 95* 98 99  CO2  --  '24 25 27 29  ' GLUCOSE 104* 80 93 87 82  BUN 28* '17 17 14 14  ' CREATININE 1.20 1.00 1.16 1.03 1.10  CALCIUM  --  8.7 8.8 9.3 9.7  MG  --  2.0  --   --   --   PHOS  --  3.6  --   --   --    GFR Estimated Creatinine Clearance: 95.1 ml/min (by C-G formula based on Cr of 1.1). Liver Function Tests:  Recent Labs Lab 02/01/14 1420  AST 14  ALT 14  ALKPHOS 86  BILITOT 0.4  PROT 6.8  ALBUMIN 3.2*   CBC:  Recent Labs Lab 02/01/14 1015 02/01/14 1023 02/01/14 1420 02/02/14 0523 02/03/14 0420 02/04/14 0441  WBC 16.1*  --  14.0* 14.4* 9.8 9.3  HGB 15.6 16.7 16.2 16.2 17.3* 15.8  HCT 44.7 49.0 46.0 45.6 49.5 46.1  MCV 89.2  --  89.5 88.7 90.5 90.4  PLT 179  --  154 183 215 227   Microbiology Recent Results (from the past 240 hour(s))  WOUND CULTURE     Status: None   Collection Time    02/01/14  9:48 AM      Result Value Ref Range Status   Specimen Description FOOT   Final   Special  Requests Normal   Final   Gram Stain     Final   Value: NO WBC SEEN     RARE SQUAMOUS EPITHELIAL CELLS PRESENT     NO ORGANISMS SEEN     Performed at [facility]   Culture     Final   Value: NO GROWTH 2 DAYS     Performed at [facility]   Report Status 02/03/2014 FINAL   Final  SURGICAL PCR SCREEN     Status: None   Collection Time    02/04/14  7:12 PM      Result Value Ref Range Status   MRSA, PCR NEGATIVE  NEGATIVE Final   Staphylococcus aureus NEGATIVE  NEGATIVE Final   Comment:            The Xpert SA Assay (FDA     approved for NASAL specimens     in patients over 1 years of age),     is one component of     a comprehensive surveillance     program.  Test performance has     been validated by [employer] for patients greater     than or equal to 21 year old.     It is not intended     to diagnose infection nor to     guide or monitor treatment.  ANAEROBIC CULTURE     Status: None   Collection Time    02/04/14  8:38 PM      Result Value Ref Range Status   Specimen Description WOUND LEFT FOOT   Final   Special Requests PATIENT ON FOLLOWING ZOSYN VANCOMYCIN   Final   Gram Stain     Final   Value: ABUNDANT WBC PRESENT,BOTH PMN AND MONONUCLEAR     NO SQUAMOUS EPITHELIAL CELLS SEEN     ABUNDANT GRAM POSITIVE COCCI IN CLUSTERS     Performed at [facility]   Culture     Final   Value: NO ANAEROBES ISOLATED; CULTURE IN PROGRESS FOR 5 DAYS     Performed at [facility]   Report Status PENDING   Incomplete  WOUND CULTURE     Status: None   Collection Time    02/04/14  8:38 PM      Result Value Ref Range Status   Specimen Description WOUND LEFT FOOT   Final   Special Requests PATIENT ON FOLLOWING ZOSYN VANCOMYCIN   Final   Gram Stain     Final   Value: ABUNDANT WBC PRESENT,BOTH PMN AND MONONUCLEAR     NO SQUAMOUS EPITHELIAL CELLS SEEN     ABUNDANT GRAM POSITIVE  COCCI IN CLUSTERS     Performed at [facility]      Final   Value: NO GROWTH     Performed at [facility]   Report Status PENDING   Incomplete     Procedures and Diagnostic Studies: Ct Foot Left W Contrast  02/01/2014   CLINICAL DATA:  Left foot pain and swelling.  Suspected insect bite.  EXAM: CT OF THE LEFT FOOT WITH CONTRAST  TECHNIQUE: Multidetector CT imaging was performed following the standard protocol during bolus administration of intravenous contrast.  CONTRAST:  1108m OMNIPAQUE IOHEXOL 300 MG/ML  SOLN  COMPARISON:  DG FOOT COMPLETE*L* dated 02/01/2014  FINDINGS: Considerable abnormal dorsal soft tissue swelling in the foot is. No drainable abscess. This tracks back to the lateral ankle.  No bony destructive findings characteristic of osteomyelitis. No significant arthropathy in the foot.  No malalignment at the Lisfranc joint.  IMPRESSION: Extensive dorsal subcutaneous edema in the foot tracking into the lateral ankle. No abscess or bony destructive findings characteristic of osteomyelitis noted.   Electronically Signed   By: WSherryl BartersM.D.   On: 02/01/2014 13:50   Dg Foot Complete Left  02/01/2014   CLINICAL DATA:  Redness and swelling overlying the lateral aspect of the foot.  EXAM: LEFT FOOT - COMPLETE 3+ VIEW  COMPARISON:  None.  FINDINGS: Normal anatomic alignment. No evidence for acute fracture or dislocation. Regional soft tissues demonstrate mild swelling about the forefoot.  IMPRESSION: Mild soft tissue swelling about the forefoot.  No evidence for underlying bony abnormality.   Electronically Signed   By: DLovey NewcomerM.D.   On: 02/01/2014 11:22    Scheduled Meds: . docusate sodium  100 mg Oral BID  . heparin  5,000 Units Subcutaneous 3 times per day  . piperacillin-tazobactam (ZOSYN)  IV  3.375 g Intravenous 3 times per day  . terbinafine   Topical BID  . vancomycin  1,000 mg Intravenous Q8H   Continuous Infusions: . sodium chloride 75 mL/hr at 02/04/14 0752  . sodium chloride 20 mL/hr at 02/05/14 0648  .  lactated ringers      Time spent: 25 minutes.    LOS: 4 days   [facility] Pager [phone]   02/05/2014, 2:33 PM

## 2014-02-05 NOTE — Progress Notes (Signed)
OT Cancellation Note  Patient Details Name: Jason Duran MRN: [id number] DOB: August 03, 1986   Cancelled Treatment:    Reason Eval/Treat Not Completed: Pain limiting ability to participate. Nursing in room to change wound vac and pt in a lot of pain. Will check back at a later time.  Lennox LaityStone, [name] [phone] 02/05/2014, 10:29 AM

## 2014-02-05 NOTE — Progress Notes (Signed)
   Subjective:  Patient reports pain as improved but hurts to touch.  Objective:   VITALS:   Filed Vitals:   02/05/14 0049 02/05/14 0126 02/05/14 0541 02/05/14 1500  BP: 154/86 156/84 137/87 162/76  Pulse: 69 55 56 69  Temp: 98.5 F (36.9 C) 98.4 F (36.9 C) 97.9 F (36.6 C) 98.1 F (36.7 C)  TempSrc:  Oral Oral Oral  Resp: 16 14 14 14   Height:      Weight:      SpO2: 99% 95% 97% 98%    - foot wwp - cellulitis improved - vac with good seal and suction   Lab Results  Component Value Date   WBC 9.3 02/04/2014   HGB 15.8 02/04/2014   HCT 46.1 02/04/2014   MCV 90.4 02/04/2014   PLT 227 02/04/2014     Assessment/Plan:  1 Day Post-Op   - Up with PT/OT - DVT ppx - SCDs, ambulation - NWB left and lower extremity - Pain control - follow intraop cultures - continue VAC - plan for repeat I&D Friday - elevate extremity on 3 pillows  Problem List Items Addressed This Visit     Musculoskeletal and Integument   Onychomycosis   Relevant Medications      vancomycin (VANCOCIN) IVPB 1000 mg/200 mL premix (Completed)      terbinafine (LAMISIL) 1 % cream      vancomycin (VANCOCIN) IVPB 1000 mg/200 mL premix     Other   Soft tissue infection of foot - Primary   *Sepsis   Leukocytosis   Hyperkalemia   Cellulitis of left foot       Cheral AlmasXu, Naiping Michael 02/05/2014, 7:46 PM [phone]

## 2014-02-05 NOTE — Progress Notes (Signed)
RN attempted wound vac dressing change due to clogged tubing. Tubing clogged with wound vac sponge.   Patient tolerated dressing change poorly.  MD made aware of dressing change, and that 2 RN's could not get the new dressing to seal properly. MD asked RN to call [facility].  Wound nurse, Lorrie called and she informed patient's RN that she would not be able to come to [facility] until later this afternoon because [facility] was [facility].  MD made aware about wound nurse location situation.  Patient informed that MD was made aware of the wound vac situation.  No further orders given at this time.   Wound is covered with wound vac dressing but wound vac is off at this time because it is not functioning properly.

## 2014-02-06 MED ORDER — METHOCARBAMOL 500 MG PO TABS
500.0000 mg | ORAL_TABLET | Freq: Four times a day (QID) | ORAL | Status: DC | PRN
  Administered 2014-02-06 – 2014-02-12 (×9): 500 mg via ORAL
  Filled 2014-02-06 (×9): qty 1

## 2014-02-06 MED ORDER — IBUPROFEN 400 MG PO TABS
400.0000 mg | ORAL_TABLET | Freq: Four times a day (QID) | ORAL | Status: DC | PRN
  Filled 2014-02-06: qty 1

## 2014-02-06 MED ORDER — HYDROMORPHONE HCL PF 1 MG/ML IJ SOLN
1.0000 mg | INTRAMUSCULAR | Status: DC | PRN
  Administered 2014-02-06: 2 mg via INTRAVENOUS
  Administered 2014-02-06: 1 mg via INTRAVENOUS
  Administered 2014-02-07 (×3): 2 mg via INTRAVENOUS
  Filled 2014-02-06 (×3): qty 2
  Filled 2014-02-06: qty 1
  Filled 2014-02-06: qty 2

## 2014-02-06 MED ORDER — OXYCODONE HCL 5 MG PO TABS
5.0000 mg | ORAL_TABLET | ORAL | Status: DC | PRN
  Administered 2014-02-06 – 2014-02-07 (×8): 15 mg via ORAL
  Filled 2014-02-06 (×8): qty 3

## 2014-02-06 MED ORDER — KETOROLAC TROMETHAMINE 30 MG/ML IJ SOLN
30.0000 mg | Freq: Four times a day (QID) | INTRAMUSCULAR | Status: AC | PRN
Start: 2014-02-06 — End: 2014-02-07
  Filled 2014-02-06: qty 1

## 2014-02-06 MED ORDER — METHOCARBAMOL 100 MG/ML IJ SOLN
500.0000 mg | Freq: Four times a day (QID) | INTRAVENOUS | Status: DC | PRN
  Filled 2014-02-06: qty 5

## 2014-02-06 NOTE — Progress Notes (Signed)
Patient stable. VAC with good seal Plan for surgery tomorrow later in pm. NPO after breakfast Friday am.  Jason ReelN. Michael Xu, MD [facility] [phone] 8:06 AM

## 2014-02-06 NOTE — Progress Notes (Signed)
PROGRESS NOTE   Jason Duran YIA:[id number] DOB: 1986-08-05 DOA: 02/01/2014 PCP: No primary provider on file.  Brief narrative: Jason Duran is an 28 y.o. male with no significant PMH who was admitted 02/01/14 with worsening left foot erythema, swelling and pain. Patient was treated as an outpatient for cellulitis with Bactrim and Keflex, and despite outpatient therapy, the patient's condition continued to worsen and he developed blistering.  Ortho consulted and he was taken to OR on 3/31  Assessment/Plan: Principal Problem:   Sepsis secondary to cellulitis/soft tissue infection of left foot Patient met criteria for SIRS with an elevated heart rate and a WBC of 16.1. With left foot cellulitis, he meets the criteria for sepsis. He failed outpatient therapy with Bactrim and Keflex. Wound cultures were sent and the patient was placed on empiric vancomycin and Zosyn. ESR not elevated CRP 14.8. No current evidence of osteomyelitis based on CT scan/plain films done on admission.  Treat underlying onychomycosis.  Spoke with Dr. Erlinda Hong, who saw the patient in consultation since he has developed an abscess.  MRI negative for underlying osteomyelitis.  WBC coming down on empiric antibiotics, hemodynamically stable and non-toxic appearing, s/p I& D on 3/31. He will undergo repeat I&D on 4/3. His wound cultures are pending. Pain control with IV pain medications.     Leukocytosis Secondary to infection. WBC normalized on broad-spectrum antibiotics.   Hyperkalemia Resolved with IVF.   Onychomycosis Lamisil cream BID ordered.   DVT Prophylaxis Continue Heparin.  Code Status: Full. Family Communication: family at bedside , discussed the plan of care.  Disposition Plan: pending further evaluation.    IV access:  Peripheral IV  Medical Consultants:  Dr. Erlinda Hong, Orthopedics  Other Consultants:  None  Anti-infectives:  Vancomycin 02/01/14--->  Zosyn 02/01/14--->   HPI/Subjective: Jason Duran feels better today than yesterday. Still strugging with pain. Increased dilaudid to 2 mg   Objective: Filed Vitals:   02/05/14 1500 02/05/14 2250 02/06/14 0546 02/06/14 1400  BP: 162/76 156/75 161/77 144/78  Pulse: 69 57 68 56  Temp: 98.1 F (36.7 C) 98.2 F (36.8 C) 98 F (36.7 C) 98.2 F (36.8 C)  TempSrc: Oral Oral Oral Oral  Resp: _0 Height:      Weight:      SpO2: 98% 99% 97% 99%    Intake/Output Summary (Last 24 hours) at 02/06/14 1454 Last data filed at 02/06/14 1434  Gross per 24 hour  Intake    800 ml  Output   1075 ml  Net   -275 ml    Exam: Gen:  NAD Cardiovascular:  RRR, No M/R/G Respiratory:  Lungs CTAB Gastrointestinal:  Abdomen soft, NT/ND, + BS Extremities:  Erythema and swelling to left foot as pictured below.  Nails thickened with skin changes consistent with dermatophyte infection. Left foot: wound vac placed on .   Data Reviewed: Basic Metabolic Panel:  Recent Labs Lab 02/01/14 1023 02/01/14 1420 02/02/14 0523 02/03/14 0420 02/04/14 0441  NA 136* 135* 133* 138 139  K 5.7* 4.8 4.5 4.3 4.5  CL 104 98 95* 98 99  CO2  --  _1 GLUCOSE 104* 80 93 87 82  BUN 28* _2 CREATININE 1.20 1.00 1.16 1.03 1.10  CALCIUM  --  8.7 8.8 9.3 9.7  MG  --  2.0  --   --   --   PHOS  --  3.6  --   --   --  GFR Estimated Creatinine Clearance: 95.1 ml/min (by C-G formula based on Cr of 1.1). Liver Function Tests:  Recent Labs Lab 02/01/14 1420  AST 14  ALT 14  ALKPHOS 86  BILITOT 0.4  PROT 6.8  ALBUMIN 3.2*   CBC:  Recent Labs Lab 02/01/14 1015 02/01/14 1023 02/01/14 1420 02/02/14 0523 02/03/14 0420 02/04/14 0441  WBC 16.1*  --  14.0* 14.4* 9.8 9.3  HGB 15.6 16.7 16.2 16.2 17.3* 15.8  HCT 44.7 49.0 46.0 45.6 49.5 46.1  MCV 89.2  --  89.5 88.7 90.5 90.4  PLT 179  --  154 183 215 227   Microbiology Recent Results (from the past 240 hour(s))  WOUND CULTURE     Status: None   Collection Time    02/01/14   9:48 AM      Result Value Ref Range Status   Specimen Description FOOT   Final   Special Requests Normal   Final   Gram Stain     Final   Value: NO WBC SEEN     RARE SQUAMOUS EPITHELIAL CELLS PRESENT     NO ORGANISMS SEEN     Performed at [facility]   Culture     Final   Value: NO GROWTH 2 DAYS     Performed at [facility]   Report Status 02/03/2014 FINAL   Final  SURGICAL PCR SCREEN     Status: None   Collection Time    02/04/14  7:12 PM      Result Value Ref Range Status   MRSA, PCR NEGATIVE  NEGATIVE Final   Staphylococcus aureus NEGATIVE  NEGATIVE Final   Comment:            The Xpert SA Assay (FDA     approved for NASAL specimens     in patients over 109 years of age),     is one component of     a comprehensive surveillance     program.  Test performance has     been validated by [employer] for patients greater     than or equal to 72 year old.     It is not intended     to diagnose infection nor to     guide or monitor treatment.  ANAEROBIC CULTURE     Status: None   Collection Time    02/04/14  8:38 PM      Result Value Ref Range Status   Specimen Description WOUND LEFT FOOT   Final   Special Requests PATIENT ON FOLLOWING ZOSYN VANCOMYCIN   Final   Gram Stain     Final   Value: ABUNDANT WBC PRESENT,BOTH PMN AND MONONUCLEAR     NO SQUAMOUS EPITHELIAL CELLS SEEN     ABUNDANT GRAM POSITIVE COCCI IN CLUSTERS     Performed at [facility]   Culture     Final   Value: NO ANAEROBES ISOLATED; CULTURE IN PROGRESS FOR 5 DAYS     Performed at [facility]   Report Status PENDING   Incomplete  WOUND CULTURE     Status: None   Collection Time    02/04/14  8:38 PM      Result Value Ref Range Status   Specimen Description WOUND LEFT FOOT   Final   Special Requests PATIENT ON FOLLOWING ZOSYN VANCOMYCIN   Final   Gram Stain     Final   Value: ABUNDANT WBC PRESENT,BOTH PMN  AND MONONUCLEAR     NO SQUAMOUS EPITHELIAL CELLS SEEN      ABUNDANT GRAM POSITIVE COCCI IN CLUSTERS     Performed at [facility]   Culture     Final   Value: NO GROWTH     Performed at [facility]   Report Status PENDING   Incomplete     Procedures and Diagnostic Studies: Ct Foot Left W Contrast  02/01/2014   CLINICAL DATA:  Left foot pain and swelling.  Suspected insect bite.  EXAM: CT OF THE LEFT FOOT WITH CONTRAST  TECHNIQUE: Multidetector CT imaging was performed following the standard protocol during bolus administration of intravenous contrast.  CONTRAST:  131m OMNIPAQUE IOHEXOL 300 MG/ML  SOLN  COMPARISON:  DG FOOT COMPLETE*L* dated 02/01/2014  FINDINGS: Considerable abnormal dorsal soft tissue swelling in the foot is. No drainable abscess. This tracks back to the lateral ankle.  No bony destructive findings characteristic of osteomyelitis. No significant arthropathy in the foot.  No malalignment at the Lisfranc joint.  IMPRESSION: Extensive dorsal subcutaneous edema in the foot tracking into the lateral ankle. No abscess or bony destructive findings characteristic of osteomyelitis noted.   Electronically Signed   By: WSherryl BartersM.D.   On: 02/01/2014 13:50   Dg Foot Complete Left  02/01/2014   CLINICAL DATA:  Redness and swelling overlying the lateral aspect of the foot.  EXAM: LEFT FOOT - COMPLETE 3+ VIEW  COMPARISON:  None.  FINDINGS: Normal anatomic alignment. No evidence for acute fracture or dislocation. Regional soft tissues demonstrate mild swelling about the forefoot.  IMPRESSION: Mild soft tissue swelling about the forefoot.  No evidence for underlying bony abnormality.   Electronically Signed   By: DLovey NewcomerM.D.   On: 02/01/2014 11:22    Scheduled Meds: . chlorhexidine  60 mL Topical Once  . docusate sodium  100 mg Oral BID  . heparin  5,000 Units Subcutaneous 3 times per day  . piperacillin-tazobactam (ZOSYN)  IV  3.375 g Intravenous 3 times per day  . terbinafine   Topical BID  . vancomycin  1,000 mg  Intravenous Q8H   Continuous Infusions: . sodium chloride 75 mL/hr at 02/04/14 0752  . sodium chloride 20 mL/hr at 02/05/14 0648  . sodium chloride 125 mL/hr at 02/06/14 1128  . lactated ringers      Time spent: 25 minutes.    LOS: 5 days   [facility] Pager [phone]   02/06/2014, 2:54 PM

## 2014-02-06 NOTE — H&P (Signed)
H&P update  The surgical history has been reviewed and remains accurate without interval change.  The patient was re-examined and patient's physiologic condition has not changed significantly in the last 30 days. The condition still exists that makes this procedure necessary. The treatment plan remains the same, without new options for care.  No new pharmacological allergies or types of therapy has been initiated that would change the plan or the appropriateness of the plan.  The patient and/or family understand the potential benefits and risks.  Mayra ReelN. Michael Janit Cutter, MD 02/06/2014 10:25 PM

## 2014-02-06 NOTE — Progress Notes (Signed)
OT Cancellation Note  Patient Details Name: Bertis RuddyBryant J Johnsey MRN: [id number] DOB: January 13, 1986   Cancelled Treatment:    Reason Eval/Treat Not Completed: Spoke with RN and noted pt for surgery tomorrow.  Please re order OT s/p surgery, as well as PT if indicated. Thanks, [employer] Deshon Koslowski   Einar CrowREDDING, Jolee Critcher D 02/06/2014, 9:48 AM

## 2014-02-07 ENCOUNTER — Encounter (HOSPITAL_COMMUNITY)
Admission: EM | Disposition: A | Discharge status: Home Health | Payer: Self-pay | Source: Home / Self Care | Attending: Internal Medicine

## 2014-02-07 ENCOUNTER — Encounter (HOSPITAL_COMMUNITY): Payer: Self-pay | Admitting: Anesthesiology

## 2014-02-07 ENCOUNTER — Inpatient Hospital Stay (HOSPITAL_COMMUNITY): Payer: Self-pay | Admitting: Anesthesiology

## 2014-02-07 HISTORY — PX: I&D EXTREMITY: SHX5045

## 2014-02-07 HISTORY — PX: APPLICATION OF WOUND VAC: SHX5189

## 2014-02-07 LAB — BASIC METABOLIC PANEL
BUN: 14 mg/dL (ref 6–23)
CALCIUM: 8.9 mg/dL (ref 8.4–10.5)
CO2: 28 mEq/L (ref 19–32)
Chloride: 100 mEq/L (ref 96–112)
Creatinine, Ser: 1.09 mg/dL (ref 0.50–1.35)
GLUCOSE: 102 mg/dL — AB (ref 70–99)
POTASSIUM: 4 meq/L (ref 3.7–5.3)
Sodium: 138 mEq/L (ref 137–147)

## 2014-02-07 LAB — CBC
HEMATOCRIT: 44 % (ref 39.0–52.0)
HEMOGLOBIN: 15.2 g/dL (ref 13.0–17.0)
MCH: 30.8 pg (ref 26.0–34.0)
MCHC: 34.5 g/dL (ref 30.0–36.0)
MCV: 89.1 fL (ref 78.0–100.0)
Platelets: 247 10*3/uL (ref 150–400)
RBC: 4.94 MIL/uL (ref 4.22–5.81)
RDW: 12.5 % (ref 11.5–15.5)
WBC: 8.6 10*3/uL (ref 4.0–10.5)

## 2014-02-07 LAB — WOUND CULTURE

## 2014-02-07 LAB — VANCOMYCIN, TROUGH: Vancomycin Tr: 15.9 ug/mL (ref 10.0–20.0)

## 2014-02-07 SURGERY — IRRIGATION AND DEBRIDEMENT EXTREMITY
Anesthesia: General | Site: Foot | Laterality: Left

## 2014-02-07 MED ORDER — HYDROMORPHONE HCL PF 1 MG/ML IJ SOLN
INTRAMUSCULAR | Status: AC
  Administered 2014-02-07: 20:00:00
  Filled 2014-02-07: qty 1

## 2014-02-07 MED ORDER — PROPOFOL 10 MG/ML IV BOLUS
INTRAVENOUS | Status: DC | PRN
  Administered 2014-02-07: 200 mg via INTRAVENOUS

## 2014-02-07 MED ORDER — 0.9 % SODIUM CHLORIDE (POUR BTL) OPTIME
TOPICAL | Status: DC | PRN
  Administered 2014-02-07: 1000 mL

## 2014-02-07 MED ORDER — SODIUM CHLORIDE 0.9 % IR SOLN
Status: DC | PRN
  Administered 2014-02-07: 3000 mL

## 2014-02-07 MED ORDER — PROMETHAZINE HCL 25 MG/ML IJ SOLN: 6.2500 mg | INTRAMUSCULAR | Status: DC | PRN

## 2014-02-07 MED ORDER — HYDROMORPHONE HCL PF 1 MG/ML IJ SOLN
0.2500 mg | INTRAMUSCULAR | Status: DC | PRN
  Administered 2014-02-07 (×4): 0.5 mg via INTRAVENOUS

## 2014-02-07 MED ORDER — PROPOFOL 10 MG/ML IV BOLUS
INTRAVENOUS | Status: AC
  Filled 2014-02-07: qty 20

## 2014-02-07 MED ORDER — ONDANSETRON HCL 4 MG/2ML IJ SOLN
INTRAMUSCULAR | Status: AC
  Filled 2014-02-07: qty 2

## 2014-02-07 MED ORDER — FENTANYL CITRATE 0.05 MG/ML IJ SOLN
INTRAMUSCULAR | Status: DC | PRN
Start: 2014-02-07 — End: 2014-02-07
  Administered 2014-02-07: 50 ug via INTRAVENOUS
  Administered 2014-02-07 (×3): 25 ug via INTRAVENOUS
  Administered 2014-02-07: 50 ug via INTRAVENOUS
  Administered 2014-02-07: 25 ug via INTRAVENOUS
  Administered 2014-02-07: 50 ug via INTRAVENOUS

## 2014-02-07 MED ORDER — OXYCODONE HCL 5 MG PO TABS: 5.0000 mg | ORAL_TABLET | ORAL | Status: DC | PRN

## 2014-02-07 MED ORDER — HYDROMORPHONE HCL PF 1 MG/ML IJ SOLN
INTRAMUSCULAR | Status: AC
  Filled 2014-02-07: qty 1

## 2014-02-07 MED ORDER — LACTATED RINGERS IV SOLN
INTRAVENOUS | Status: DC | PRN
  Administered 2014-02-07: 18:00:00 via INTRAVENOUS

## 2014-02-07 MED ORDER — ONDANSETRON HCL 4 MG/2ML IJ SOLN
INTRAMUSCULAR | Status: DC | PRN
  Administered 2014-02-07: 4 mg via INTRAVENOUS

## 2014-02-07 MED ORDER — MORPHINE SULFATE 2 MG/ML IJ SOLN
1.0000 mg | INTRAMUSCULAR | Status: DC | PRN
  Filled 2014-02-07: qty 1

## 2014-02-07 MED ORDER — MIDAZOLAM HCL 2 MG/2ML IJ SOLN
INTRAMUSCULAR | Status: AC
  Filled 2014-02-07: qty 2

## 2014-02-07 MED ORDER — MIDAZOLAM HCL 5 MG/5ML IJ SOLN
INTRAMUSCULAR | Status: DC | PRN
  Administered 2014-02-07: 2 mg via INTRAVENOUS

## 2014-02-07 MED ORDER — FENTANYL CITRATE 0.05 MG/ML IJ SOLN
INTRAMUSCULAR | Status: AC
  Filled 2014-02-07: qty 5

## 2014-02-07 SURGICAL SUPPLY — 67 items
BANDAGE CONFORM 3  STR LF (GAUZE/BANDAGES/DRESSINGS) IMPLANT
BANDAGE ELASTIC 3 VELCRO ST LF (GAUZE/BANDAGES/DRESSINGS) IMPLANT
BLADE SURG 10 STRL SS (BLADE) ×3 IMPLANT
BNDG COHESIVE 1X5 TAN STRL LF (GAUZE/BANDAGES/DRESSINGS) IMPLANT
BNDG COHESIVE 4X5 TAN STRL (GAUZE/BANDAGES/DRESSINGS) IMPLANT
BNDG COHESIVE 6X5 TAN STRL LF (GAUZE/BANDAGES/DRESSINGS) IMPLANT
BNDG GAUZE STRTCH 6 (GAUZE/BANDAGES/DRESSINGS) IMPLANT
CORDS BIPOLAR (ELECTRODE) IMPLANT
COVER SURGICAL LIGHT HANDLE (MISCELLANEOUS) ×3 IMPLANT
CUFF TOURNIQUET SINGLE 24IN (TOURNIQUET CUFF) IMPLANT
CUFF TOURNIQUET SINGLE 34IN LL (TOURNIQUET CUFF) ×6 IMPLANT
CUFF TOURNIQUET SINGLE 44IN (TOURNIQUET CUFF) IMPLANT
DRAPE EXTREMITY BILATERAL (DRAPE) IMPLANT
DRAPE IMP U-DRAPE 54X76 (DRAPES) IMPLANT
DRAPE INCISE IOBAN 66X45 STRL (DRAPES) IMPLANT
DRAPE SURG 17X23 STRL (DRAPES) IMPLANT
DRAPE U-SHAPE 47X51 STRL (DRAPES) ×3 IMPLANT
DRSG VAC ATS SM SENSATRAC (GAUZE/BANDAGES/DRESSINGS) ×3 IMPLANT
DURAPREP 26ML APPLICATOR (WOUND CARE) IMPLANT
ELECT CAUTERY BLADE 6.4 (BLADE) ×3 IMPLANT
ELECT REM PT RETURN 9FT ADLT (ELECTROSURGICAL) ×3
ELECTRODE REM PT RTRN 9FT ADLT (ELECTROSURGICAL) ×1 IMPLANT
FACESHIELD WRAPAROUND (MASK) ×6 IMPLANT
GAUZE XEROFORM 1X8 LF (GAUZE/BANDAGES/DRESSINGS) IMPLANT
GAUZE XEROFORM 5X9 LF (GAUZE/BANDAGES/DRESSINGS) IMPLANT
GLOVE BIOGEL PI IND STRL 8.5 (GLOVE) ×1 IMPLANT
GLOVE BIOGEL PI INDICATOR 8.5 (GLOVE) ×2
GLOVE SURG SS PI 7.5 STRL IVOR (GLOVE) ×6 IMPLANT
GLOVE SURG SS PI 8.5 STRL IVOR (GLOVE) ×2
GLOVE SURG SS PI 8.5 STRL STRW (GLOVE) ×1 IMPLANT
GOWN STRL REUS W/ TWL LRG LVL3 (GOWN DISPOSABLE) ×1 IMPLANT
GOWN STRL REUS W/ TWL XL LVL3 (GOWN DISPOSABLE) ×1 IMPLANT
GOWN STRL REUS W/TWL LRG LVL3 (GOWN DISPOSABLE) ×2
GOWN STRL REUS W/TWL XL LVL3 (GOWN DISPOSABLE) ×2
HANDPIECE INTERPULSE COAX TIP (DISPOSABLE)
KIT BASIN OR (CUSTOM PROCEDURE TRAY) ×3 IMPLANT
KIT ROOM TURNOVER OR (KITS) ×3 IMPLANT
MANIFOLD NEPTUNE II (INSTRUMENTS) ×3 IMPLANT
NS IRRIG 1000ML POUR BTL (IV SOLUTION) ×3 IMPLANT
PACK ORTHO EXTREMITY (CUSTOM PROCEDURE TRAY) ×3 IMPLANT
PAD ABD 8X10 STRL (GAUZE/BANDAGES/DRESSINGS) IMPLANT
PAD ARMBOARD 7.5X6 YLW CONV (MISCELLANEOUS) ×3 IMPLANT
PADDING CAST ABS 4INX4YD NS (CAST SUPPLIES)
PADDING CAST ABS COTTON 4X4 ST (CAST SUPPLIES) IMPLANT
PADDING CAST COTTON 6X4 STRL (CAST SUPPLIES) IMPLANT
SET HNDPC FAN SPRY TIP SCT (DISPOSABLE) IMPLANT
SOL PREP POV-IOD 16OZ 10% (MISCELLANEOUS) ×3 IMPLANT
SOL PREP PROV IODINE SCRUB 4OZ (MISCELLANEOUS) ×3 IMPLANT
SPONGE GAUZE 4X4 12PLY (GAUZE/BANDAGES/DRESSINGS) IMPLANT
SPONGE LAP 18X18 X RAY DECT (DISPOSABLE) ×3 IMPLANT
STOCKINETTE IMPERVIOUS 9X36 MD (GAUZE/BANDAGES/DRESSINGS) ×3 IMPLANT
SUT ETHILON 2 0 FS 18 (SUTURE) IMPLANT
SUT ETHILON 2 0 PSLX (SUTURE) IMPLANT
SUT ETHILON 3 0 PS 1 (SUTURE) IMPLANT
SUT VIC AB 2-0 CT1 36 (SUTURE) IMPLANT
SUT VIC AB 2-0 FS1 27 (SUTURE) IMPLANT
SYR CONTROL 10ML LL (SYRINGE) IMPLANT
TOWEL OR 17X24 6PK STRL BLUE (TOWEL DISPOSABLE) ×3 IMPLANT
TOWEL OR 17X26 10 PK STRL BLUE (TOWEL DISPOSABLE) ×3 IMPLANT
TUBE ANAEROBIC SPECIMEN COL (MISCELLANEOUS) IMPLANT
TUBE CONNECTING 12'X1/4 (SUCTIONS)
TUBE CONNECTING 12X1/4 (SUCTIONS) IMPLANT
TUBE FEEDING 5FR 15 INCH (TUBING) IMPLANT
TUBING CYSTO DISP (UROLOGICAL SUPPLIES) ×3 IMPLANT
UNDERPAD 30X30 INCONTINENT (UNDERPADS AND DIAPERS) ×3 IMPLANT
WATER STERILE IRR 1000ML POUR (IV SOLUTION) IMPLANT
YANKAUER SUCT BULB TIP NO VENT (SUCTIONS) IMPLANT

## 2014-02-07 NOTE — Progress Notes (Signed)
PROGRESS NOTE   DHEERAJ HAIL GDJ:[id number] DOB: 09-Feb-1986 DOA: 02/01/2014 PCP: No primary provider on file.  Brief narrative: Jason Duran is an 28 y.o. male with no significant PMH who was admitted 02/01/14 with worsening left foot erythema, swelling and pain. Patient was treated as an outpatient for cellulitis with Bactrim and Keflex, and despite outpatient therapy, the patient's condition continued to worsen and he developed blistering.  Ortho consulted and he was taken to OR on 3/31  Assessment/Plan: Principal Problem:   Sepsis secondary to cellulitis/soft tissue infection of left foot Patient met criteria for SIRS with an elevated heart rate and a WBC of 16.1. With left foot cellulitis, he meets the criteria for sepsis. He failed outpatient therapy with Bactrim and Keflex. Wound cultures were sent and the patient was placed on empiric vancomycin and Zosyn. ESR not elevated CRP 14.8. No current evidence of osteomyelitis based on CT scan/plain films done on admission.  Treat underlying onychomycosis.  Spoke with Dr. Erlinda Hong, who saw the patient in consultation since he has developed an abscess.  MRI negative for underlying osteomyelitis.  WBC coming down on empiric antibiotics, hemodynamically stable and non-toxic appearing, s/p I& D on 3/31. He will undergo repeat I&D on 4/3. His wound cultures grew MRSA, he is continued on iv VANCOMYCIN and zosyn discontinued.  Pain control with IV pain medications.     Leukocytosis Secondary to infection. WBC normalized on broad-spectrum antibiotics.   Hyperkalemia Resolved with IVF.   Onychomycosis Lamisil cream BID ordered.   DVT Prophylaxis Continue Heparin.  Code Status: Full. Family Communication: family at bedside , discussed the plan of care.  Disposition Plan: pending further evaluation.    IV access:  Peripheral IV  Medical Consultants:  Dr. Erlinda Hong, Orthopedics  Other Consultants:  None  Anti-infectives:  Vancomycin  02/01/14--->  Zosyn 02/01/14--->   HPI/Subjective: Jason Duran feels better today than yesterday. Still strugging with pain. Increased dilaudid to 2 mg   Objective: Filed Vitals:   02/06/14 1400 02/06/14 2212 02/07/14 0615 02/07/14 1109  BP: 144/78 136/80 139/83 148/90  Pulse: 56 65 62 63  Temp: 98.2 F (36.8 C) 98.4 F (36.9 C) 97.9 F (36.6 C) 97.8 F (36.6 C)  TempSrc: Oral Oral Oral Oral  Resp: '16 18 18 18  ' Height:      Weight:      SpO2: 99% 98% 99% 98%    Intake/Output Summary (Last 24 hours) at 02/07/14 1346 Last data filed at 02/07/14 1000  Gross per 24 hour  Intake   9360 ml  Output   2450 ml  Net   6910 ml    Exam: Gen:  NAD Cardiovascular:  RRR, No M/R/G Respiratory:  Lungs CTAB Gastrointestinal:  Abdomen soft, NT/ND, + BS Extremities:  Erythema and swelling to left foot as pictured below.  Nails thickened with skin changes consistent with dermatophyte infection. Left foot: wound vac placed on .   Data Reviewed: Basic Metabolic Panel:  Recent Labs Lab 02/01/14 1023 02/01/14 1420 02/02/14 0523 02/03/14 0420 02/04/14 0441 02/07/14 0500  NA 136* 135* 133* 138 139 138  K 5.7* 4.8 4.5 4.3 4.5 4.0  CL 104 98 95* 98 99 100  CO2  --  '24 25 27 29 28  ' GLUCOSE 104* 80 93 87 82 102*  BUN 28* '17 17 14 14 14  ' CREATININE 1.20 1.00 1.16 1.03 1.10 1.09  CALCIUM  --  8.7 8.8 9.3 9.7 8.9  MG  --  2.0  --   --   --   --   PHOS  --  3.6  --   --   --   --    GFR Estimated Creatinine Clearance: 96 ml/min (by C-G formula based on Cr of 1.09). Liver Function Tests:  Recent Labs Lab 02/01/14 1420  AST 14  ALT 14  ALKPHOS 86  BILITOT 0.4  PROT 6.8  ALBUMIN 3.2*   CBC:  Recent Labs Lab 02/01/14 1420 02/02/14 0523 02/03/14 0420 02/04/14 0441 02/07/14 0500  WBC 14.0* 14.4* 9.8 9.3 8.6  HGB 16.2 16.2 17.3* 15.8 15.2  HCT 46.0 45.6 49.5 46.1 44.0  MCV 89.5 88.7 90.5 90.4 89.1  PLT 154 183 215 227 247   Microbiology Recent Results (from the  past 240 hour(s))  WOUND CULTURE     Status: None   Collection Time    02/01/14  9:48 AM      Result Value Ref Range Status   Specimen Description FOOT   Final   Special Requests Normal   Final   Gram Stain     Final   Value: NO WBC SEEN     RARE SQUAMOUS EPITHELIAL CELLS PRESENT     NO ORGANISMS SEEN     Performed at [facility]   Culture     Final   Value: NO GROWTH 2 DAYS     Performed at [facility]   Report Status 02/03/2014 FINAL   Final  SURGICAL PCR SCREEN     Status: None   Collection Time    02/04/14  7:12 PM      Result Value Ref Range Status   MRSA, PCR NEGATIVE  NEGATIVE Final   Staphylococcus aureus NEGATIVE  NEGATIVE Final   Comment:            The Xpert SA Assay (FDA     approved for NASAL specimens     in patients over 12 years of age),     is one component of     a comprehensive surveillance     program.  Test performance has     been validated by [employer] for patients greater     than or equal to 53 year old.     It is not intended     to diagnose infection nor to     guide or monitor treatment.  ANAEROBIC CULTURE     Status: None   Collection Time    02/04/14  8:38 PM      Result Value Ref Range Status   Specimen Description WOUND LEFT FOOT   Final   Special Requests PATIENT ON FOLLOWING ZOSYN VANCOMYCIN   Final   Gram Stain     Final   Value: ABUNDANT WBC PRESENT,BOTH PMN AND MONONUCLEAR     NO SQUAMOUS EPITHELIAL CELLS SEEN     ABUNDANT GRAM POSITIVE COCCI IN CLUSTERS     Performed at [facility]   Culture     Final   Value: NO ANAEROBES ISOLATED; CULTURE IN PROGRESS FOR 5 DAYS     Performed at [facility]   Report Status PENDING   Incomplete  WOUND CULTURE     Status: None   Collection Time    02/04/14  8:38 PM      Result Value Ref Range Status   Specimen Description WOUND LEFT FOOT   Final   Special Requests PATIENT ON FOLLOWING ZOSYN  VANCOMYCIN   Final   Gram Stain     Final   Value: ABUNDANT  WBC PRESENT,BOTH PMN AND MONONUCLEAR     NO SQUAMOUS EPITHELIAL CELLS SEEN     ABUNDANT GRAM POSITIVE COCCI IN CLUSTERS     Performed at [facility]   Culture     Final   Value: FEW METHICILLIN RESISTANT STAPHYLOCOCCUS AUREUS     Note: RIFAMPIN AND GENTAMICIN SHOULD NOT BE USED AS SINGLE DRUGS FOR TREATMENT OF STAPH INFECTIONS. This organism is presumed to be Clindamycin resistant based on detection of inducible Clindamycin resistance. CRITICAL RESULT CALLED TO, READ BACK BY AND      VERIFIED WITH: Gerilyn Nestle 02/07/13 0950 BY SMITHERSJ     Performed at [facility]   Report Status 02/07/2014 FINAL   Final   Organism ID, Bacteria METHICILLIN RESISTANT STAPHYLOCOCCUS AUREUS   Final     Procedures and Diagnostic Studies: Ct Foot Left W Contrast  02/01/2014   CLINICAL DATA:  Left foot pain and swelling.  Suspected insect bite.  EXAM: CT OF THE LEFT FOOT WITH CONTRAST  TECHNIQUE: Multidetector CT imaging was performed following the standard protocol during bolus administration of intravenous contrast.  CONTRAST:  156m OMNIPAQUE IOHEXOL 300 MG/ML  SOLN  COMPARISON:  DG FOOT COMPLETE*L* dated 02/01/2014  FINDINGS: Considerable abnormal dorsal soft tissue swelling in the foot is. No drainable abscess. This tracks back to the lateral ankle.  No bony destructive findings characteristic of osteomyelitis. No significant arthropathy in the foot.  No malalignment at the Lisfranc joint.  IMPRESSION: Extensive dorsal subcutaneous edema in the foot tracking into the lateral ankle. No abscess or bony destructive findings characteristic of osteomyelitis noted.   Electronically Signed   By: WSherryl BartersM.D.   On: 02/01/2014 13:50   Dg Foot Complete Left  02/01/2014   CLINICAL DATA:  Redness and swelling overlying the lateral aspect of the foot.  EXAM: LEFT FOOT - COMPLETE 3+ VIEW  COMPARISON:  None.  FINDINGS: Normal anatomic alignment. No evidence for acute fracture or dislocation. Regional  soft tissues demonstrate mild swelling about the forefoot.  IMPRESSION: Mild soft tissue swelling about the forefoot.  No evidence for underlying bony abnormality.   Electronically Signed   By: DLovey NewcomerM.D.   On: 02/01/2014 11:22    Scheduled Meds: . docusate sodium  100 mg Oral BID  . heparin  5,000 Units Subcutaneous 3 times per day  . terbinafine   Topical BID  . vancomycin  1,000 mg Intravenous Q8H   Continuous Infusions: . sodium chloride 20 mL/hr at 02/05/14 0648    Time spent: 25 minutes.    LOS: 6 days   [facility] Pager [phone]   02/07/2014, 1:46 PM

## 2014-02-07 NOTE — Anesthesia Preprocedure Evaluation (Signed)
Anesthesia Evaluation  Patient identified by MRN, date of birth, ID band Patient awake    Reviewed: Allergy & Precautions, H&P , NPO status , Patient's Chart, lab work & pertinent test results  Airway Mallampati: II TM Distance: >3 FB Neck ROM: Full    Dental no notable dental hx.    Pulmonary Current Smoker,  breath sounds clear to auscultation  Pulmonary exam normal       Cardiovascular negative cardio ROS  Rhythm:Regular Rate:Normal     Neuro/Psych negative neurological ROS  negative psych ROS   GI/Hepatic negative GI ROS, Neg liver ROS,   Endo/Other  negative endocrine ROS  Renal/GU negative Renal ROS  negative genitourinary   Musculoskeletal negative musculoskeletal ROS (+)   Abdominal   Peds negative pediatric ROS (+)  Hematology negative hematology ROS (+)   Anesthesia Other Findings   Reproductive/Obstetrics negative OB ROS                           Anesthesia Physical Anesthesia Plan  ASA: II  Anesthesia Plan: General   Post-op Pain Management:    Induction: Intravenous  Airway Management Planned: LMA  Additional Equipment:   Intra-op Plan:   Post-operative Plan: Extubation in OR  Informed Consent: I have reviewed the patients History and Physical, chart, labs and discussed the procedure including the risks, benefits and alternatives for the proposed anesthesia with the patient or authorized representative who has indicated his/her understanding and acceptance.   Dental advisory given  Plan Discussed with: CRNA  Anesthesia Plan Comments:         Anesthesia Quick Evaluation  

## 2014-02-07 NOTE — Transfer of Care (Signed)
Immediate Anesthesia Transfer of Care Note  Patient: Jason Duran  Procedure(s) Performed: Procedure(s): IRRIGATION AND DEBRIDEMENT LEFT FOOT, WOUND VAC (Left) APPLICATION OF WOUND VAC (Left)  Patient Location: PACU  Anesthesia Type:General  Level of Consciousness: awake, alert  and patient cooperative  Airway & Oxygen Therapy: Patient Spontanous Breathing and Patient connected to face mask oxygen  Post-op Assessment: Report given to PACU RN and Post -op Vital signs reviewed and stable  Post vital signs: Reviewed and stable  Complications: No apparent anesthesia complications

## 2014-02-07 NOTE — Op Note (Signed)
   Date of Surgery: 02/07/2014  INDICATIONS: Mr. Jason Duran is a 28 y.o.-year-old male who returns today for another look at his left foot infection ;  The patient did consent to the procedure after discussion of the risks and benefits.  PREOPERATIVE DIAGNOSIS: left foot infection s/p irrigation and debridement with skin defect 4 x 3 cm  POSTOPERATIVE DIAGNOSIS: Same.  PROCEDURE:  1. Irrigation and debridement of skin, muscle. 2. Application of negative wound therapy <50 sq cm2.   SURGEON: N. Glee ArvinMichael Heavenlee Maiorana, M.D.  ASSIST: none.  ANESTHESIA:  general  IV FLUIDS AND URINE: See anesthesia.  ESTIMATED BLOOD LOSS: minimal mL.  IMPLANTS: none  COMPLICATIONS: None.  DESCRIPTION OF PROCEDURE: The patient was brought to the operating room and placed supine on the operating table.  The patient had been signed prior to the procedure and this was documented. The patient had the anesthesia placed by the anesthesiologist.  A time-out was performed to confirm that this was the correct patient, site, side and location. The patient had an SCD on the opposite lower extremity. The patient did receive antibiotics prior to the incision and was re-dosed during the procedure as needed at indicated intervals.  The patient had the operative extremity prepped and draped in the standard surgical fashion.    I inspected the wound and there was a small age of the skin defect that had necrosis. The rest of the wound appeared to be healthy and alive with red beefy granulating tissue. The rest of the skin edge was also viable. The skin was sharply excised using a knife. The muscle was sharply debrided using rongeur. I then irrigated 6 L of normal saline through the wound using cystoscopy tubing. I then placed a wound VAC on the wound and set the pressure today 125 mm mercury. The wound measured 4 x 3 cm  POSTOPERATIVE PLAN: The patient will continue to be nonweightbearing to the foot. He needs to continue his antibiotics. I  will plan over Hemovac and another 48-72 hours for another look. I anticipate that the wound will be ready for skin grafting at that time.  Mayra ReelN. Michael Deanza Upperman, MD [facility] [phone] 7:52 PM

## 2014-02-07 NOTE — Progress Notes (Signed)
ANTIBIOTIC CONSULT NOTE - Follow Up  Pharmacy Consult for vancomycin, piperacillin/tazobactam Indication: LE cellulitis  No Known Allergies  Patient Measurements: Height: 5\' 7"  (170.2 cm) Weight: 170 lb (77.111 kg) IBW/kg (Calculated) : 66.1 Adjusted Body Weight:   Vital Signs: Temp: 97.8 F (36.6 C) (04/03 1109) Temp src: Oral (04/03 1109) BP: 148/90 mmHg (04/03 1109) Pulse Rate: 63 (04/03 1109) Intake/Output from previous day: 04/02 0701 - 04/03 0700 In: 9600 [P.O.:600; I.V.:9000] Out: 2950 [Urine:2950] Intake/Output from this shift: Total I/O In: 500 [I.V.:500] Out: -   Labs:  Recent Labs  02/07/14 0500  WBC 8.6  HGB 15.2  PLT 247  CREATININE 1.09   Estimated Creatinine Clearance: 96 ml/min (by C-G formula based on Cr of 1.09).  Recent Labs  02/04/14 1915 02/07/14 1140  VANCOTROUGH 6.8* 15.9     Microbiology: Recent Results (from the past 720 hour(s))  WOUND CULTURE     Status: None   Collection Time    02/01/14  9:48 AM      Result Value Ref Range Status   Specimen Description FOOT   Final   Special Requests Normal   Final   Gram Stain     Final   Value: NO WBC SEEN     RARE SQUAMOUS EPITHELIAL CELLS PRESENT     NO ORGANISMS SEEN     Performed at [facility]   Culture     Final   Value: NO GROWTH 2 DAYS     Performed at [facility]   Report Status 02/03/2014 FINAL   Final  SURGICAL PCR SCREEN     Status: None   Collection Time    02/04/14  7:12 PM      Result Value Ref Range Status   MRSA, PCR NEGATIVE  NEGATIVE Final   Staphylococcus aureus NEGATIVE  NEGATIVE Final   Comment:            The Xpert SA Assay (FDA     approved for NASAL specimens     in patients over 50 years of age),     is one component of     a comprehensive surveillance     program.  Test performance has     been validated by [employer] for patients greater     than or equal to 66 year old.     It is not intended     to diagnose infection nor  to     guide or monitor treatment.  ANAEROBIC CULTURE     Status: None   Collection Time    02/04/14  8:38 PM      Result Value Ref Range Status   Specimen Description WOUND LEFT FOOT   Final   Special Requests PATIENT ON FOLLOWING ZOSYN VANCOMYCIN   Final   Gram Stain     Final   Value: ABUNDANT WBC PRESENT,BOTH PMN AND MONONUCLEAR     NO SQUAMOUS EPITHELIAL CELLS SEEN     ABUNDANT GRAM POSITIVE COCCI IN CLUSTERS     Performed at [facility]   Culture     Final   Value: NO ANAEROBES ISOLATED; CULTURE IN PROGRESS FOR 5 DAYS     Performed at [facility]   Report Status PENDING   Incomplete  WOUND CULTURE     Status: None   Collection Time    02/04/14  8:38 PM      Result Value Ref Range Status  Specimen Description WOUND LEFT FOOT   Final   Special Requests PATIENT ON FOLLOWING ZOSYN VANCOMYCIN   Final   Gram Stain     Final   Value: ABUNDANT WBC PRESENT,BOTH PMN AND MONONUCLEAR     NO SQUAMOUS EPITHELIAL CELLS SEEN     ABUNDANT GRAM POSITIVE COCCI IN CLUSTERS     [facility]   Culture     Final   Value: FEW METHICILLIN RESISTANT STAPHYLOCOCCUS AUREUS     Note: RIFAMPIN AND GENTAMICIN SHOULD NOT BE USED AS SINGLE DRUGS FOR TREATMENT OF STAPH INFECTIONS. This organism is presumed to be Clindamycin resistant based on detection of inducible Clindamycin resistance. CRITICAL RESULT CALLED TO, READ BACK BY AND      VERIFIED WITH: Justice BritainLANCIE CLARK 02/07/13 0950 BY SMITHERSJ     [facility]   Report Status 02/07/2014 FINAL   Final   Organism ID, Bacteria METHICILLIN RESISTANT STAPHYLOCOCCUS AUREUS   Final    Medical History: History reviewed. No pertinent past medical history.  Assessment: 27 YOM failing outpatient cephalexin and TMP/SMZ presents with L LE erythema, pain and swelling. Orders to start broad spectrum abx.  3/28 >> vancomycin >> 3/28 >>zosyn >>   Tmax: afeb WBCs: improved to WNL Renal: SCr = 1.10 stable,  CrCl > 100  3/28: wound cx (foot):ng-final 3/31 wound I&D: few MRSA  Drug level / dose changes info: 3/31 VT at 1900 = 6.8 (goal 10-15) -reload with 1500mg  x1, increase to 1g q8h 4/3 VT at 1130 = 15.9 mcg/ml on 1gm IV q8h (prior to 8th dose)  Goal of Therapy:  Vancomycin trough level 10-15 mcg/ml  Plan:  1) Continue Vancomycin 1gm IV q8h as level is appropriate 2) Continue Zosyn 3.375gm IV q8h with 4h infusion  Juliette Alcideustin Preslie Depasquale, PharmD, BCPS.   Pager: [phone] 02/07/2014 1:08 PM

## 2014-02-08 MED ORDER — SENNOSIDES-DOCUSATE SODIUM 8.6-50 MG PO TABS
2.0000 | ORAL_TABLET | Freq: Two times a day (BID) | ORAL | Status: DC
  Administered 2014-02-08 – 2014-02-12 (×5): 2 via ORAL
  Filled 2014-02-08 (×13): qty 2

## 2014-02-08 MED ORDER — POLYETHYLENE GLYCOL 3350 17 G PO PACK
17.0000 g | PACK | Freq: Every day | ORAL | Status: DC
Start: 2014-02-08 — End: 2014-02-13
  Administered 2014-02-08 – 2014-02-13 (×5): 17 g via ORAL
  Filled 2014-02-08 (×6): qty 1

## 2014-02-08 NOTE — Progress Notes (Signed)
   Subjective:  Patient reports pain as mild.    Objective:   VITALS:   Filed Vitals:   02/07/14 2200 02/07/14 2255 02/08/14 0622 02/08/14 1038  BP: 151/81 158/84 143/76 158/94  Pulse: 57 65 55 77  Temp: 97.8 F (36.6 C) 100.1 F (37.8 C) 97.9 F (36.6 C) 97.9 F (36.6 C)  TempSrc:  Oral Oral Oral  Resp: 14 16 16 16   Height:      Weight:      SpO2: 100% 97% 98% 97%    VAC with good seal   Lab Results  Component Value Date   WBC 8.6 02/07/2014   HGB 15.2 02/07/2014   HCT 44.0 02/07/2014   MCV 89.1 02/07/2014   PLT 247 02/07/2014     Assessment/Plan:  1 Day Post-Op   - NWB left lower extremity - plan for repeat I&D with possible STSG on Monday   Problem List Items Addressed This Visit     Musculoskeletal and Integument   Onychomycosis   Relevant Medications      vancomycin (VANCOCIN) IVPB 1000 mg/200 mL premix (Completed)      terbinafine (LAMISIL) 1 % cream      vancomycin (VANCOCIN) IVPB 1000 mg/200 mL premix     Other   Soft tissue infection of foot - Primary   *Sepsis   Leukocytosis   Hyperkalemia   Cellulitis of left foot       Cheral AlmasXu, Naiping Michael 02/08/2014, 12:05 PM [phone]

## 2014-02-08 NOTE — Progress Notes (Signed)
PROGRESS NOTE   Jason Duran TEL:[id number] DOB: 02-Jul-1986 DOA: 02/01/2014 PCP: No primary provider on file.  Brief narrative: Jason Duran is an 28 y.o. male with no significant PMH who was admitted 02/01/14 with worsening left foot erythema, swelling and pain. Patient was treated as an outpatient for cellulitis with Bactrim and Keflex, and despite outpatient therapy, the patient's condition continued to worsen and he developed blistering.  Ortho consulted and he was taken to OR on 3/31  Assessment/Plan: Principal Problem:   Sepsis secondary to cellulitis/soft tissue infection of left foot Patient met criteria for SIRS with an elevated heart rate and a WBC of 16.1. With left foot cellulitis, he meets the criteria for sepsis. He failed outpatient therapy with Bactrim and Keflex. Wound cultures were sent and the patient was placed on empiric vancomycin and Zosyn. ESR not elevated CRP 14.8. No current evidence of osteomyelitis based on CT scan/plain films done on admission.  Treat underlying onychomycosis.  Spoke with Dr. Erlinda Duran, who saw the patient in consultation since he has developed an abscess.  MRI negative for underlying osteomyelitis.  WBC coming down on empiric antibiotics, hemodynamically stable and non-toxic appearing, s/p I& D on 3/31. He underwent repeat I&D on 4/3. His wound cultures grew MRSA, he is continued on iv VANCOMYCIN and zosyn discontinued.  Pain control with IV pain medications.     Leukocytosis Secondary to infection. WBC normalized on broad-spectrum antibiotics.   Hyperkalemia Resolved with IVF.   Onychomycosis Lamisil cream BID ordered.  Constipation: Stool softners and miralax.    DVT Prophylaxis Continue Heparin.  Code Status: Full. Family Communication: family at bedside , discussed the plan of care.  Disposition Plan: pending further evaluation.    IV access:  Peripheral IV  Medical Consultants:  Dr. Erlinda Duran, Orthopedics  Other  Consultants:  None  Anti-infectives:  Vancomycin 02/01/14--->  Zosyn 02/01/14--->   HPI/Subjective: Jason Duran feels better today than yesterday. Pain better controlled.  Objective: Filed Vitals:   02/07/14 2255 02/08/14 0622 02/08/14 1038 02/08/14 1440  BP: 158/84 143/76 158/94 149/77  Pulse: 65 55 77 74  Temp: 100.1 F (37.8 C) 97.9 F (36.6 C) 97.9 F (36.6 C) 98.6 F (37 C)  TempSrc: Oral Oral Oral Oral  Resp: '16 16 16 14  ' Height:      Weight:      SpO2: 97% 98% 97% 100%    Intake/Output Summary (Last 24 hours) at 02/08/14 1510 Last data filed at 02/08/14 1440  Gross per 24 hour  Intake    840 ml  Output    650 ml  Net    190 ml    Exam: Gen:  NAD Cardiovascular:  RRR, No M/R/G Respiratory:  Lungs CTAB Gastrointestinal:  Abdomen soft, NT/ND, + BS Extremities:  Erythema and swelling to left foot as pictured below.  Nails thickened with skin changes consistent with dermatophyte infection. Left foot: wound vac placed on .   Data Reviewed: Basic Metabolic Panel:  Recent Labs Lab 02/02/14 0523 02/03/14 0420 02/04/14 0441 02/07/14 0500  NA 133* 138 139 138  K 4.5 4.3 4.5 4.0  CL 95* 98 99 100  CO2 '25 27 29 28  ' GLUCOSE 93 87 82 102*  BUN '17 14 14 14  ' CREATININE 1.16 1.03 1.10 1.09  CALCIUM 8.8 9.3 9.7 8.9   GFR Estimated Creatinine Clearance: 96 ml/min (by C-G formula based on Cr of 1.09). Liver Function Tests: No results found for this basename: AST, ALT,  ALKPHOS, BILITOT, PROT, ALBUMIN,  in the last 168 hours CBC:  Recent Labs Lab 02/02/14 0523 02/03/14 0420 02/04/14 0441 02/07/14 0500  WBC 14.4* 9.8 9.3 8.6  HGB 16.2 17.3* 15.8 15.2  HCT 45.6 49.5 46.1 44.0  MCV 88.7 90.5 90.4 89.1  PLT 183 215 227 247   Microbiology Recent Results (from the past 240 hour(s))  WOUND CULTURE     Status: None   Collection Time    02/01/14  9:48 AM      Result Value Ref Range Status   Specimen Description FOOT   Final   Special Requests Normal    Final   Gram Stain     Final   Value: NO WBC SEEN     RARE SQUAMOUS EPITHELIAL CELLS PRESENT     NO ORGANISMS SEEN     Performed at [facility]   Culture     Final   Value: NO GROWTH 2 DAYS     Performed at [facility]   Report Status 02/03/2014 FINAL   Final  SURGICAL PCR SCREEN     Status: None   Collection Time    02/04/14  7:12 PM      Result Value Ref Range Status   MRSA, PCR NEGATIVE  NEGATIVE Final   Staphylococcus aureus NEGATIVE  NEGATIVE Final   Comment:            The Xpert SA Assay (FDA     approved for NASAL specimens     in patients over 14 years of age),     is one component of     a comprehensive surveillance     program.  Test performance has     been validated by [employer] for patients greater     than or equal to 26 year old.     It is not intended     to diagnose infection nor to     guide or monitor treatment.  ANAEROBIC CULTURE     Status: None   Collection Time    02/04/14  8:38 PM      Result Value Ref Range Status   Specimen Description WOUND LEFT FOOT   Final   Special Requests PATIENT ON FOLLOWING ZOSYN VANCOMYCIN   Final   Gram Stain     Final   Value: ABUNDANT WBC PRESENT,BOTH PMN AND MONONUCLEAR     NO SQUAMOUS EPITHELIAL CELLS SEEN     ABUNDANT GRAM POSITIVE COCCI IN CLUSTERS     Performed at [facility]   Culture     Final   Value: NO ANAEROBES ISOLATED; CULTURE IN PROGRESS FOR 5 DAYS     Performed at [facility]   Report Status PENDING   Incomplete  WOUND CULTURE     Status: None   Collection Time    02/04/14  8:38 PM      Result Value Ref Range Status   Specimen Description WOUND LEFT FOOT   Final   Special Requests PATIENT ON FOLLOWING ZOSYN VANCOMYCIN   Final   Gram Stain     Final   Value: ABUNDANT WBC PRESENT,BOTH PMN AND MONONUCLEAR     NO SQUAMOUS EPITHELIAL CELLS SEEN     ABUNDANT GRAM POSITIVE COCCI IN CLUSTERS     Performed at [facility]   Culture     Final   Value:  FEW METHICILLIN RESISTANT STAPHYLOCOCCUS AUREUS     Note: RIFAMPIN  AND GENTAMICIN SHOULD NOT BE USED AS SINGLE DRUGS FOR TREATMENT OF STAPH INFECTIONS. This organism is presumed to be Clindamycin resistant based on detection of inducible Clindamycin resistance. CRITICAL RESULT CALLED TO, READ BACK BY AND      VERIFIED WITH: Gerilyn Nestle 02/07/13 0950 BY SMITHERSJ     Performed at [facility]   Report Status 02/07/2014 FINAL   Final   Organism ID, Bacteria METHICILLIN RESISTANT STAPHYLOCOCCUS AUREUS   Final     Procedures and Diagnostic Studies: Ct Foot Left W Contrast  02/01/2014   CLINICAL DATA:  Left foot pain and swelling.  Suspected insect bite.  EXAM: CT OF THE LEFT FOOT WITH CONTRAST  TECHNIQUE: Multidetector CT imaging was performed following the standard protocol during bolus administration of intravenous contrast.  CONTRAST:  166m OMNIPAQUE IOHEXOL 300 MG/ML  SOLN  COMPARISON:  DG FOOT COMPLETE*L* dated 02/01/2014  FINDINGS: Considerable abnormal dorsal soft tissue swelling in the foot is. No drainable abscess. This tracks back to the lateral ankle.  No bony destructive findings characteristic of osteomyelitis. No significant arthropathy in the foot.  No malalignment at the Lisfranc joint.  IMPRESSION: Extensive dorsal subcutaneous edema in the foot tracking into the lateral ankle. No abscess or bony destructive findings characteristic of osteomyelitis noted.   Electronically Signed   By: WSherryl BartersM.D.   On: 02/01/2014 13:50   Dg Foot Complete Left  02/01/2014   CLINICAL DATA:  Redness and swelling overlying the lateral aspect of the foot.  EXAM: LEFT FOOT - COMPLETE 3+ VIEW  COMPARISON:  None.  FINDINGS: Normal anatomic alignment. No evidence for acute fracture or dislocation. Regional soft tissues demonstrate mild swelling about the forefoot.  IMPRESSION: Mild soft tissue swelling about the forefoot.  No evidence for underlying bony abnormality.   Electronically Signed   By:  DLovey NewcomerM.D.   On: 02/01/2014 11:22    Scheduled Meds: . heparin  5,000 Units Subcutaneous 3 times per day  . polyethylene glycol  17 g Oral Daily  . senna-docusate  2 tablet Oral BID  . terbinafine   Topical BID  . vancomycin  1,000 mg Intravenous Q8H   Continuous Infusions:    Time spent: 25 minutes.    LOS: 7 days   [facility]ospital[facility] Pager [phone]   02/08/2014, 3:10 PM

## 2014-02-08 NOTE — Anesthesia Postprocedure Evaluation (Signed)
  Anesthesia Post-op Note  Patient: Jason Duran  Procedure(s) Performed: Procedure(s) (LRB): IRRIGATION AND DEBRIDEMENT LEFT FOOT, WOUND VAC (Left) APPLICATION OF WOUND VAC (Left)  Patient Location: PACU  Anesthesia Type: General  Level of Consciousness: awake and alert   Airway and Oxygen Therapy: Patient Spontanous Breathing  Post-op Pain: mild  Post-op Assessment: Post-op Vital signs reviewed, Patient's Cardiovascular Status Stable, Respiratory Function Stable, Patent Airway and No signs of Nausea or vomiting  Last Vitals:  Filed Vitals:   02/08/14 0622  BP: 143/76  Pulse: 55  Temp: 36.6 C  Resp: 16    Post-op Vital Signs: stable   Complications: No apparent anesthesia complications

## 2014-02-09 LAB — ANAEROBIC CULTURE

## 2014-02-09 MED ORDER — MORPHINE SULFATE 2 MG/ML IJ SOLN
1.0000 mg | INTRAMUSCULAR | Status: DC | PRN
  Administered 2014-02-09 – 2014-02-11 (×11): 2 mg via INTRAVENOUS
  Filled 2014-02-09 (×12): qty 1

## 2014-02-09 MED ORDER — HYDROCODONE-ACETAMINOPHEN 5-325 MG PO TABS
2.0000 | ORAL_TABLET | ORAL | Status: DC | PRN
  Administered 2014-02-09 – 2014-02-11 (×7): 2 via ORAL
  Filled 2014-02-09 (×9): qty 2

## 2014-02-09 NOTE — Progress Notes (Signed)
PROGRESS NOTE   KAMARIAN SAHAKIAN TWK:[id number] DOB: 17-Sep-1986 DOA: 02/01/2014 PCP: No primary provider on file.  Brief narrative: Jason Duran is an 28 y.o. male with no significant PMH who was admitted 02/01/14 with worsening left foot erythema, swelling and pain. Patient was treated as an outpatient for cellulitis with Bactrim and Keflex, and despite outpatient therapy, the patient's condition continued to worsen and he developed blistering.  Ortho consulted and he was taken to OR on 3/31  Assessment/Plan: Principal Problem:   Sepsis secondary to cellulitis/soft tissue infection of left foot Patient met criteria for SIRS with an elevated heart rate and a WBC of 16.1. With left foot cellulitis, he meets the criteria for sepsis. He failed outpatient therapy with Bactrim and Keflex. Wound cultures were sent and the patient was placed on empiric vancomycin and Zosyn. ESR not elevated CRP 14.8. No current evidence of osteomyelitis based on CT scan/plain films done on admission.  Treat underlying onychomycosis.  Spoke with Dr. Erlinda Hong, who saw the patient in consultation since he has developed an abscess.  MRI negative for underlying osteomyelitis.  WBC coming down on empiric antibiotics, hemodynamically stable and non-toxic appearing, s/p I& D on 3/31. He underwent repeat I&D on 4/3. His wound cultures grew MRSA, he is continued on iv VANCOMYCIN and zosyn discontinued.  Pain control with IV pain medications.  Plan for skin graft in am.     Leukocytosis Secondary to infection. WBC normalized on broad-spectrum antibiotics.   Hyperkalemia Resolved with IVF.   Onychomycosis Lamisil cream BID ordered.  Constipation: Stool softners and miralax.    DVT Prophylaxis Continue Heparin.  Code Status: Full. Family Communication: family at bedside , discussed the plan of care.  Disposition Plan: pending further evaluation.    IV access:  Peripheral IV  Medical Consultants:  Dr. Erlinda Hong,  Orthopedics  Other Consultants:  None  Anti-infectives:  Vancomycin 02/01/14--->  Zosyn 02/01/14--->   HPI/Subjective: Jason Duran feels better today than yesterday. Pain better controlled.  Objective: Filed Vitals:   02/08/14 1038 02/08/14 1440 02/08/14 2219 02/09/14 0601  BP: 158/94 149/77 162/87 131/72  Pulse: 77 74 68 53  Temp: 97.9 F (36.6 C) 98.6 F (37 C) 99 F (37.2 C) 97.8 F (36.6 C)  TempSrc: Oral Oral Oral Oral  Resp: '16 14 16 16  ' Height:      Weight:      SpO2: 97% 100% 97% 98%    Intake/Output Summary (Last 24 hours) at 02/09/14 1014 Last data filed at 02/09/14 0609  Gross per 24 hour  Intake    600 ml  Output   1250 ml  Net   -650 ml    Exam: Gen:  NAD Cardiovascular:  RRR, No M/R/G Respiratory:  Lungs CTAB Gastrointestinal:  Abdomen soft, NT/ND, + BS Extremities:  Erythema and swelling to left foot as pictured below.  Nails thickened with skin changes consistent with dermatophyte infection. Left foot: wound vac placed on .   Data Reviewed: Basic Metabolic Panel:  Recent Labs Lab 02/03/14 0420 02/04/14 0441 02/07/14 0500  NA 138 139 138  K 4.3 4.5 4.0  CL 98 99 100  CO2 '27 29 28  ' GLUCOSE 87 82 102*  BUN '14 14 14  ' CREATININE 1.03 1.10 1.09  CALCIUM 9.3 9.7 8.9   GFR Estimated Creatinine Clearance: 96 ml/min (by C-G formula based on Cr of 1.09). Liver Function Tests: No results found for this basename: AST, ALT, ALKPHOS, BILITOT, PROT, ALBUMIN,  in the last 168 hours CBC:  Recent Labs Lab 02/03/14 0420 02/04/14 0441 02/07/14 0500  WBC 9.8 9.3 8.6  HGB 17.3* 15.8 15.2  HCT 49.5 46.1 44.0  MCV 90.5 90.4 89.1  PLT 215 227 247   Microbiology Recent Results (from the past 240 hour(s))  WOUND CULTURE     Status: None   Collection Time    02/01/14  9:48 AM      Result Value Ref Range Status   Specimen Description FOOT   Final   Special Requests Normal   Final   Gram Stain     Final   Value: NO WBC SEEN     RARE  SQUAMOUS EPITHELIAL CELLS PRESENT     NO ORGANISMS SEEN     Performed at [facility]   Culture     Final   Value: NO GROWTH 2 DAYS     Performed at [facility]   Report Status 02/03/2014 FINAL   Final  SURGICAL PCR SCREEN     Status: None   Collection Time    02/04/14  7:12 PM      Result Value Ref Range Status   MRSA, PCR NEGATIVE  NEGATIVE Final   Staphylococcus aureus NEGATIVE  NEGATIVE Final   Comment:            The Xpert SA Assay (FDA     approved for NASAL specimens     in patients over 48 years of age),     is one component of     a comprehensive surveillance     program.  Test performance has     been validated by [employer] for patients greater     than or equal to 10 year old.     It is not intended     to diagnose infection nor to     guide or monitor treatment.  ANAEROBIC CULTURE     Status: None   Collection Time    02/04/14  8:38 PM      Result Value Ref Range Status   Specimen Description WOUND LEFT FOOT   Final   Special Requests PATIENT ON FOLLOWING ZOSYN VANCOMYCIN   Final   Gram Stain     Final   Value: ABUNDANT WBC PRESENT,BOTH PMN AND MONONUCLEAR     NO SQUAMOUS EPITHELIAL CELLS SEEN     ABUNDANT GRAM POSITIVE COCCI IN CLUSTERS     Performed at [facility]   Culture     Final   Value: NO ANAEROBES ISOLATED; CULTURE IN PROGRESS FOR 5 DAYS     Performed at [facility]   Report Status PENDING   Incomplete  WOUND CULTURE     Status: None   Collection Time    02/04/14  8:38 PM      Result Value Ref Range Status   Specimen Description WOUND LEFT FOOT   Final   Special Requests PATIENT ON FOLLOWING ZOSYN VANCOMYCIN   Final   Gram Stain     Final   Value: ABUNDANT WBC PRESENT,BOTH PMN AND MONONUCLEAR     NO SQUAMOUS EPITHELIAL CELLS SEEN     ABUNDANT GRAM POSITIVE COCCI IN CLUSTERS     Performed at [facility]   Culture     Final   Value: FEW METHICILLIN RESISTANT STAPHYLOCOCCUS AUREUS     Note:  RIFAMPIN AND GENTAMICIN SHOULD NOT BE USED AS SINGLE DRUGS FOR TREATMENT OF  STAPH INFECTIONS. This organism is presumed to be Clindamycin resistant based on detection of inducible Clindamycin resistance. CRITICAL RESULT CALLED TO, READ BACK BY AND      VERIFIED WITH: Gerilyn Nestle 02/07/13 0950 BY SMITHERSJ     Performed at [facility]   Report Status 02/07/2014 FINAL   Final   Organism ID, Bacteria METHICILLIN RESISTANT STAPHYLOCOCCUS AUREUS   Final     Procedures and Diagnostic Studies: Ct Foot Left W Contrast  02/01/2014   CLINICAL DATA:  Left foot pain and swelling.  Suspected insect bite.  EXAM: CT OF THE LEFT FOOT WITH CONTRAST  TECHNIQUE: Multidetector CT imaging was performed following the standard protocol during bolus administration of intravenous contrast.  CONTRAST:  125m OMNIPAQUE IOHEXOL 300 MG/ML  SOLN  COMPARISON:  DG FOOT COMPLETE*L* dated 02/01/2014  FINDINGS: Considerable abnormal dorsal soft tissue swelling in the foot is. No drainable abscess. This tracks back to the lateral ankle.  No bony destructive findings characteristic of osteomyelitis. No significant arthropathy in the foot.  No malalignment at the Lisfranc joint.  IMPRESSION: Extensive dorsal subcutaneous edema in the foot tracking into the lateral ankle. No abscess or bony destructive findings characteristic of osteomyelitis noted.   Electronically Signed   By: WSherryl BartersM.D.   On: 02/01/2014 13:50   Dg Foot Complete Left  02/01/2014   CLINICAL DATA:  Redness and swelling overlying the lateral aspect of the foot.  EXAM: LEFT FOOT - COMPLETE 3+ VIEW  COMPARISON:  None.  FINDINGS: Normal anatomic alignment. No evidence for acute fracture or dislocation. Regional soft tissues demonstrate mild swelling about the forefoot.  IMPRESSION: Mild soft tissue swelling about the forefoot.  No evidence for underlying bony abnormality.   Electronically Signed   By: DLovey NewcomerM.D.   On: 02/01/2014 11:22    Scheduled  Meds: . heparin  5,000 Units Subcutaneous 3 times per day  . polyethylene glycol  17 g Oral Daily  . senna-docusate  2 tablet Oral BID  . terbinafine   Topical BID  . vancomycin  1,000 mg Intravenous Q8H   Continuous Infusions:    Time spent: 25 minutes.    LOS: 8 days   [facility] Pager [phone]   02/09/2014, 10:14 AM

## 2014-02-09 NOTE — Progress Notes (Signed)
Subjective:  Patient reports no events  Objective:   VITALS:   Filed Vitals:   02/08/14 1038 02/08/14 1440 02/08/14 2219 02/09/14 0601  BP: 158/94 149/77 162/87 131/72  Pulse: 77 74 68 53  Temp: 97.9 F (36.6 C) 98.6 F (37 C) 99 F (37.2 C) 97.8 F (36.6 C)  TempSrc: Oral Oral Oral Oral  Resp: 16 14 16 16   Height:      Weight:      SpO2: 97% 100% 97% 98%    VAC with good seal   Lab Results  Component Value Date   WBC 8.6 02/07/2014   HGB 15.2 02/07/2014   HCT 44.0 02/07/2014   MCV 89.1 02/07/2014   PLT 247 02/07/2014     Assessment/Plan:  2 Days Post-Op   - NWB left lower extremity - plan for repeat I&D with possible STSG on Monday vs. Tuesday depending on OR availability - patient aware - continue IV abx for MRSA, anticipate needing at least 6 weeks of therapy   Problem List Items Addressed This Visit     Musculoskeletal and Integument   Onychomycosis   Relevant Medications      vancomycin (VANCOCIN) IVPB 1000 mg/200 mL premix (Completed)      terbinafine (LAMISIL) 1 % cream      vancomycin (VANCOCIN) IVPB 1000 mg/200 mL premix     Other   Soft tissue infection of foot - Primary   *Sepsis   Leukocytosis   Hyperkalemia   Cellulitis of left foot       Cheral AlmasXu, Naiping Michael 02/09/2014, 11:02 AM [phone]

## 2014-02-10 ENCOUNTER — Encounter (HOSPITAL_COMMUNITY): Payer: Self-pay | Admitting: Orthopaedic Surgery

## 2014-02-10 LAB — BASIC METABOLIC PANEL
BUN: 22 mg/dL (ref 6–23)
CHLORIDE: 99 meq/L (ref 96–112)
CO2: 31 meq/L (ref 19–32)
CREATININE: 1.09 mg/dL (ref 0.50–1.35)
Calcium: 9.5 mg/dL (ref 8.4–10.5)
GFR calc Af Amer: >90 mL/min (ref >90)
GFR calc non Af Amer: >90 mL/min (ref >90)
GLUCOSE: 116 mg/dL — AB (ref 70–99)
Potassium: 5 mEq/L (ref 3.7–5.3)
Sodium: 139 mEq/L (ref 137–147)

## 2014-02-10 LAB — VANCOMYCIN, TROUGH: VANCOMYCIN TR: 9.2 ug/mL — AB (ref 10.0–20.0)

## 2014-02-10 MED ORDER — SODIUM CHLORIDE 0.9 % IJ SOLN
10.0000 mL | INTRAMUSCULAR | Status: DC | PRN
  Administered 2014-02-10: 10 mL

## 2014-02-10 MED ORDER — CEFAZOLIN SODIUM-DEXTROSE 2-3 GM-% IV SOLR
2.0000 g | INTRAVENOUS | Status: AC
Start: 2014-02-11 — End: 2014-02-11
  Administered 2014-02-11: 2 g via INTRAVENOUS
  Filled 2014-02-10: qty 50

## 2014-02-10 MED ORDER — SODIUM CHLORIDE 0.9 % IV SOLN
INTRAVENOUS | Status: DC
  Administered 2014-02-10: 16:00:00 via INTRAVENOUS

## 2014-02-10 MED ORDER — CHLORHEXIDINE GLUCONATE 4 % EX LIQD
60.0000 mL | Freq: Once | CUTANEOUS | Status: AC
Start: 2014-02-11 — End: 2014-02-11
  Administered 2014-02-11: 4 via TOPICAL
  Filled 2014-02-10: qty 60

## 2014-02-10 NOTE — Progress Notes (Signed)
Peripherally Inserted Central Catheter/Midline Placement  The IV Nurse has discussed with the patient and/or persons authorized to consent for the patient, the purpose of this procedure and the potential benefits and risks involved with this procedure.  The benefits include less needle sticks, lab draws from the catheter and patient may be discharged home with the catheter.  Risks include, but not limited to, infection, bleeding, blood clot (thrombus formation), and puncture of an artery; nerve damage and irregular heat beat.  Alternatives to this procedure were also discussed.  PICC/Midline Placement Documentation  PICC / Midline Single Lumen 02/10/14 PICC Right Brachial 41 cm 0 cm (Active)  Indication for Insertion or Continuance of Line Home intravenous therapies (PICC only) 02/10/2014  4:00 PM  Exposed Catheter (cm) 0 cm 02/10/2014  4:00 PM  Dressing Change Due 02/17/14 02/10/2014  4:00 PM       Stacie GlazeJoyce, Shanece Cochrane Horton 02/10/2014, 4:14 PM

## 2014-02-10 NOTE — Progress Notes (Signed)
Met with pt at bedside to diiscuss d/c planning. He is going to need a wound vac and IV antibiotics at home. He has support at home and is also teachable on how to administer the antibiotics. [facility] is aware of the [facility] needs and will begin teaching.  Dr Erlinda Hong needs to decide which wound vac to use at home. The [facility] NPWT or [employer] one. I am awaiting a call back form him in regards to this.  Allene Dillon RN BSN   [phone]

## 2014-02-10 NOTE — Progress Notes (Signed)
ANTIBIOTIC CONSULT NOTE - Follow Up  Pharmacy Consult for vancomycin Indication: LE cellulitis  No Known Allergies  Patient Measurements: Height: 5\' 7"  (170.2 cm) Weight: 170 lb (77.111 kg) IBW/kg (Calculated) : 66.1   Vital Signs: Temp: 97.6 F (36.4 C) (04/06 0525) Temp src: Oral (04/06 0525) BP: 149/78 mmHg (04/06 0525) Pulse Rate: 59 (04/06 0525) Intake/Output from previous day: 04/05 0701 - 04/06 0700 In: 1080 [P.O.:1080] Out: 550 [Urine:550] Intake/Output from this shift:    Labs:  Recent Labs  02/10/14 0542  CREATININE 1.09   Estimated Creatinine Clearance: 96 ml/min (by C-G formula based on Cr of 1.09).  Recent Labs  02/07/14 1140  VANCOTROUGH 15.9     Microbiology: Recent Results (from the past 720 hour(s))  WOUND CULTURE     Status: None   Collection Time    02/01/14  9:48 AM      Result Value Ref Range Status   Specimen Description FOOT   Final   Special Requests Normal   Final   Gram Stain     Final   Value: NO WBC SEEN     RARE SQUAMOUS EPITHELIAL CELLS PRESENT     NO ORGANISMS SEEN     [facility]   Culture     Final   Value: NO GROWTH 2 DAYS     [facility] at [facility]   Report Status 02/03/2014 FINAL   Final  SURGICAL PCR SCREEN     Status: None   Collection Time    02/04/14  7:12 PM      Result Value Ref Range Status   MRSA, PCR NEGATIVE  NEGATIVE Final   Staphylococcus aureus NEGATIVE  NEGATIVE Final   Comment:            The Xpert SA Assay (FDA     approved for NASAL specimens     in patients over 27 years of age),     is one component of     a comprehensive surveillance     program.  Test performance has     been validated by [facility] for patients greater     than or equal to 27 year old.     It is not intended     to diagnose infection nor to     guide or monitor treatment.  ANAEROBIC CULTURE     Status: None   Collection Time    02/04/14  8:38 PM      Result Value Ref Range  Status   Specimen Description WOUND LEFT FOOT   Final   Special Requests PATIENT ON FOLLOWING ZOSYN VANCOMYCIN   Final   Gram Stain     Final   Value: ABUNDANT WBC PRESENT,BOTH PMN AND MONONUCLEAR     NO SQUAMOUS EPITHELIAL CELLS SEEN     ABUNDANT GRAM POSITIVE COCCI IN CLUSTERS     Performed at [facility]   Culture     Final   Value: NO ANAEROBES ISOLATED     Performed at [facility]   Report Status 02/09/2014 FINAL   Final  WOUND CULTURE     Status: None   Collection Time    02/04/14  8:38 PM      Result Value Ref Range Status   Specimen Description WOUND LEFT FOOT   Final   Special Requests PATIENT ON FOLLOWING ZOSYN VANCOMYCIN   Final   Gram Stain  Final   Value: ABUNDANT WBC PRESENT,BOTH PMN AND MONONUCLEAR     NO SQUAMOUS EPITHELIAL CELLS SEEN     ABUNDANT GRAM POSITIVE COCCI IN CLUSTERS     Performed at [facility]   Culture     Final   Value: FEW METHICILLIN RESISTANT STAPHYLOCOCCUS AUREUS     Note: RIFAMPIN AND GENTAMICIN SHOULD NOT BE USED AS SINGLE DRUGS FOR TREATMENT OF STAPH INFECTIONS. This organism is presumed to be Clindamycin resistant based on detection of inducible Clindamycin resistance. CRITICAL RESULT CALLED TO, READ BACK BY AND      VERIFIED WITH: Justice Britain 02/07/13 0950 BY SMITHERSJ     Performed at [facility]   Report Status 02/07/2014 FINAL   Final   Organism ID, Bacteria METHICILLIN RESISTANT STAPHYLOCOCCUS AUREUS   Final    Medical History: History reviewed. No pertinent past medical history.  Assessment: 27 YOM failing outpatient cephalexin and TMP/SMZ presented with L LE erythema, pain and swelling. Orders received to begin empiric vancomycin + Zosyn.  Regimen de-escalated to vancomycin monotherapy after culture from wound I&D grew MRSA.  4/6: D#10 vancomycin, current dosage 1 gram IV q8h  Vancomycin trough was acceptable on this dosage on 4/3.  SCr today 1.09 - stable.    Plans for OR noted -  awaiting word on timing (today or tomorrow)  Goal of Therapy: Appropriate antibiotic dosing for renal function; eradication of infection.  Vancomycin trough level 10-15 mcg/ml  Plan:  1. Continue current vancomycin dosage (1 gram IV q8h). 2. Follow serum creatinine, clinical course. 3. Recheck vancomycin trough within next few days.  Elie Goody, PharmD, BCPS Pager: [phone] 02/10/2014  8:05 AM

## 2014-02-10 NOTE — Progress Notes (Signed)
PROGRESS NOTE   Jason Duran TAV:[id number] DOB: [birth year] DOA: 02/01/2014 PCP: No primary provider on file.  Brief narrative: Jason Duran is an 28 y.o. male with no significant PMH who was admitted 02/01/14 with worsening left foot erythema, swelling and pain. Patient was treated as an outpatient for cellulitis with Bactrim and Keflex, and despite outpatient therapy, the patient's condition continued to worsen and he developed blistering.  Ortho consulted and he was taken to OR on 3/31  Assessment/Plan: Principal Problem:   Sepsis secondary to cellulitis/soft tissue infection of left foot Patient met criteria for SIRS with an elevated heart rate and a WBC of 16.1. With left foot cellulitis, he meets the criteria for sepsis. He failed outpatient therapy with Bactrim and Keflex. Wound cultures were sent and the patient was placed on empiric vancomycin and Zosyn. ESR not elevated CRP 14.8. No current evidence of osteomyelitis based on CT scan/plain films done on admission.  Treat underlying onychomycosis.  Spoke with Dr. Erlinda Duran, who saw the patient in consultation since he has developed an abscess.  MRI negative for underlying osteomyelitis.  WBC coming down on empiric antibiotics, hemodynamically stable and non-toxic appearing, s/p I& D on 3/31. He underwent repeat I&D on 4/3. His wound cultures grew MRSA, he is continued on iv VANCOMYCIN and zosyn discontinued.  Pain control with IV pain medications.  Plan for skin graft in am.  PICC line ordered.     Leukocytosis Secondary to infection. WBC normalized on broad-spectrum antibiotics.   Hyperkalemia Resolved with IVF.   Onychomycosis Lamisil cream BID ordered.  Constipation: Stool softners and miralax.    DVT Prophylaxis Continue Heparin.  Code Status: Full. Family Communication: family at bedside , discussed the plan of care.  Disposition Plan: pending further evaluation.    IV access:  Peripheral IV  Medical  Consultants:  Dr. Erlinda Duran, Orthopedics  Other Consultants:  None  Anti-infectives:  Vancomycin 02/01/14--->  Zosyn 02/01/14--->   HPI/Subjective: Jason Duran feels better today than yesterday. Pain better controlled.  Objective: Filed Vitals:   02/09/14 1400 02/09/14 2147 02/10/14 0525 02/10/14 1443  BP: 146/78 160/90 149/78 155/103  Pulse: 68 81 59 84  Temp: 98.1 F (36.7 C) 97.9 F (36.6 C) 97.6 F (36.4 C) 98.6 F (37 C)  TempSrc: Oral Oral Oral Oral  Resp: '18 18 16 18  ' Height:      Weight:      SpO2: 100% 100% 97% 98%    Intake/Output Summary (Last 24 hours) at 02/10/14 1924 Last data filed at 02/10/14 1827  Gross per 24 hour  Intake    240 ml  Output   1300 ml  Net  -1060 ml    Exam: Gen:  NAD Cardiovascular:  RRR, No M/R/G Respiratory:  Lungs CTAB Gastrointestinal:  Abdomen soft, NT/ND, + BS Extremities:  Erythema and swelling to left foot as pictured below.  Nails thickened with skin changes consistent with dermatophyte infection. Left foot: wound vac placed on .   Data Reviewed: Basic Metabolic Panel:  Recent Labs Lab 02/04/14 0441 02/07/14 0500 02/10/14 0542  NA 139 138 139  K 4.5 4.0 5.0  CL 99 100 99  CO2 '29 28 31  ' GLUCOSE 82 102* 116*  BUN '14 14 22  ' CREATININE 1.10 1.09 1.09  CALCIUM 9.7 8.9 9.5   GFR Estimated Creatinine Clearance: 96 ml/min (by C-G formula based on Cr of 1.09). Liver Function Tests: No results found for this basename: AST, ALT, ALKPHOS, BILITOT,  PROT, ALBUMIN,  in the last 168 hours CBC:  Recent Labs Lab 02/04/14 0441 02/07/14 0500  WBC 9.3 8.6  HGB 15.8 15.2  HCT 46.1 44.0  MCV 90.4 89.1  PLT 227 247   Microbiology Recent Results (from the past 240 hour(s))  WOUND CULTURE     Status: None   Collection Time    02/01/14  9:48 AM      Result Value Ref Range Status   Specimen Description FOOT   Final   Special Requests Normal   Final   Gram Stain     Final   Value: NO WBC SEEN     RARE SQUAMOUS  EPITHELIAL CELLS PRESENT     NO ORGANISMS SEEN     Performed at [facility]   Culture     Final   Value: NO GROWTH 2 DAYS     Performed at [facility]   Report Status 02/03/2014 FINAL   Final  SURGICAL PCR SCREEN     Status: None   Collection Time    02/04/14  7:12 PM      Result Value Ref Range Status   MRSA, PCR NEGATIVE  NEGATIVE Final   Staphylococcus aureus NEGATIVE  NEGATIVE Final   Comment:            The Xpert SA Assay (FDA     approved for NASAL specimens     in patients over 9 years of age),     is one component of     a comprehensive surveillance     program.  Test performance has     been validated by [employer] for patients greater     than or equal to 50 year old.     It is not intended     to diagnose infection nor to     guide or monitor treatment.  ANAEROBIC CULTURE     Status: None   Collection Time    02/04/14  8:38 PM      Result Value Ref Range Status   Specimen Description WOUND LEFT FOOT   Final   Special Requests PATIENT ON FOLLOWING ZOSYN VANCOMYCIN   Final   Gram Stain     Final   Value: ABUNDANT WBC PRESENT,BOTH PMN AND MONONUCLEAR     NO SQUAMOUS EPITHELIAL CELLS SEEN     ABUNDANT GRAM POSITIVE COCCI IN CLUSTERS     Performed at [facility]   Culture     Final   Value: NO ANAEROBES ISOLATED     Performed at [facility]   Report Status 02/09/2014 FINAL   Final  WOUND CULTURE     Status: None   Collection Time    02/04/14  8:38 PM      Result Value Ref Range Status   Specimen Description WOUND LEFT FOOT   Final   Special Requests PATIENT ON FOLLOWING ZOSYN VANCOMYCIN   Final   Gram Stain     Final   Value: ABUNDANT WBC PRESENT,BOTH PMN AND MONONUCLEAR     NO SQUAMOUS EPITHELIAL CELLS SEEN     ABUNDANT GRAM POSITIVE COCCI IN CLUSTERS     Performed at [facility]   Culture     Final   Value: FEW METHICILLIN RESISTANT STAPHYLOCOCCUS AUREUS     Note: RIFAMPIN AND GENTAMICIN SHOULD NOT BE  USED AS SINGLE DRUGS FOR TREATMENT OF STAPH INFECTIONS. This organism is presumed to be Clindamycin  resistant based on detection of inducible Clindamycin resistance. CRITICAL RESULT CALLED TO, READ BACK BY AND      VERIFIED WITH: Gerilyn Nestle 02/07/13 0950 BY SMITHERSJ     Performed at [facility]   Report Status 02/07/2014 FINAL   Final   Organism ID, Bacteria METHICILLIN RESISTANT STAPHYLOCOCCUS AUREUS   Final     Procedures and Diagnostic Studies: Ct Foot Left W Contrast  02/01/2014   CLINICAL DATA:  Left foot pain and swelling.  Suspected insect bite.  EXAM: CT OF THE LEFT FOOT WITH CONTRAST  TECHNIQUE: Multidetector CT imaging was performed following the standard protocol during bolus administration of intravenous contrast.  CONTRAST:  162m OMNIPAQUE IOHEXOL 300 MG/ML  SOLN  COMPARISON:  DG FOOT COMPLETE*L* dated 02/01/2014  FINDINGS: Considerable abnormal dorsal soft tissue swelling in the foot is. No drainable abscess. This tracks back to the lateral ankle.  No bony destructive findings characteristic of osteomyelitis. No significant arthropathy in the foot.  No malalignment at the Lisfranc joint.  IMPRESSION: Extensive dorsal subcutaneous edema in the foot tracking into the lateral ankle. No abscess or bony destructive findings characteristic of osteomyelitis noted.   Electronically Signed   By: WSherryl BartersM.D.   On: 02/01/2014 13:50   Dg Foot Complete Left  02/01/2014   CLINICAL DATA:  Redness and swelling overlying the lateral aspect of the foot.  EXAM: LEFT FOOT - COMPLETE 3+ VIEW  COMPARISON:  None.  FINDINGS: Normal anatomic alignment. No evidence for acute fracture or dislocation. Regional soft tissues demonstrate mild swelling about the forefoot.  IMPRESSION: Mild soft tissue swelling about the forefoot.  No evidence for underlying bony abnormality.   Electronically Signed   By: DLovey NewcomerM.D.   On: 02/01/2014 11:22    Scheduled Meds: . [START ON 02/11/2014]  ceFAZolin  (ANCEF) IV  2 g Intravenous On Call to OR  . [START ON 02/11/2014] chlorhexidine  60 mL Topical Once  . heparin  5,000 Units Subcutaneous 3 times per day  . polyethylene glycol  17 g Oral Daily  . senna-docusate  2 tablet Oral BID  . terbinafine   Topical BID  . vancomycin  1,000 mg Intravenous Q8H   Continuous Infusions: . sodium chloride 125 mL/hr at 02/10/14 1617    Time spent: 25 minutes.    LOS: 9 days   [facility] Pager [phone]   02/10/2014, 7:24 PM

## 2014-02-10 NOTE — Progress Notes (Signed)
Subjective:  Patient reports no events  Objective:   VITALS:   Filed Vitals:   02/09/14 0601 02/09/14 1400 02/09/14 2147 02/10/14 0525  BP: 131/72 146/78 160/90 149/78  Pulse: 53 68 81 59  Temp: 97.8 F (36.6 C) 98.1 F (36.7 C) 97.9 F (36.6 C) 97.6 F (36.4 C)  TempSrc: Oral Oral Oral Oral  Resp: 16 18 18 16   Height:      Weight:      SpO2: 98% 100% 100% 97%    VAC with good seal   Lab Results  Component Value Date   WBC 8.6 02/07/2014   HGB 15.2 02/07/2014   HCT 44.0 02/07/2014   MCV 89.1 02/07/2014   PLT 247 02/07/2014     Assessment/Plan:  3 Days Post-Op   - NWB left lower extremity - repeat I&D and STSG Tuesday - continue IV abx for MRSA, anticipate needing at least 6 weeks of therapy   Problem List Items Addressed This Visit     Musculoskeletal and Integument   Onychomycosis   Relevant Medications      vancomycin (VANCOCIN) IVPB 1000 mg/200 mL premix (Completed)      terbinafine (LAMISIL) 1 % cream      vancomycin (VANCOCIN) IVPB 1000 mg/200 mL premix     Other   Soft tissue infection of foot - Primary   *Sepsis   Leukocytosis   Hyperkalemia   Cellulitis of left foot       Cheral AlmasXu, Caterine Mcmeans Michael 02/10/2014, 1:31 PM [phone]

## 2014-02-11 ENCOUNTER — Inpatient Hospital Stay (HOSPITAL_COMMUNITY): Payer: Self-pay | Admitting: Certified Registered Nurse Anesthetist

## 2014-02-11 ENCOUNTER — Encounter (HOSPITAL_COMMUNITY): Payer: Self-pay | Admitting: Certified Registered Nurse Anesthetist

## 2014-02-11 ENCOUNTER — Encounter (HOSPITAL_COMMUNITY)
Admission: EM | Disposition: A | Discharge status: Home Health | Payer: Self-pay | Source: Home / Self Care | Attending: Internal Medicine

## 2014-02-11 HISTORY — PX: I&D EXTREMITY: SHX5045

## 2014-02-11 SURGERY — IRRIGATION AND DEBRIDEMENT EXTREMITY
Anesthesia: General | Site: Thigh | Laterality: Left

## 2014-02-11 MED ORDER — ONDANSETRON HCL 4 MG/2ML IJ SOLN
INTRAMUSCULAR | Status: AC
  Filled 2014-02-11: qty 2

## 2014-02-11 MED ORDER — SODIUM CHLORIDE 0.9 % IJ SOLN
INTRAMUSCULAR | Status: AC
  Filled 2014-02-11: qty 10

## 2014-02-11 MED ORDER — 0.9 % SODIUM CHLORIDE (POUR BTL) OPTIME
TOPICAL | Status: DC | PRN
  Administered 2014-02-11: 1000 mL

## 2014-02-11 MED ORDER — PROPOFOL 10 MG/ML IV BOLUS
INTRAVENOUS | Status: DC | PRN
  Administered 2014-02-11: 200 mg via INTRAVENOUS

## 2014-02-11 MED ORDER — MINERAL OIL LIGHT 100 % EX OIL
TOPICAL_OIL | CUTANEOUS | Status: AC
  Filled 2014-02-11: qty 25

## 2014-02-11 MED ORDER — LACTATED RINGERS IV SOLN
INTRAVENOUS | Status: DC | PRN
  Administered 2014-02-11 (×2): via INTRAVENOUS

## 2014-02-11 MED ORDER — ONDANSETRON HCL 4 MG/2ML IJ SOLN
INTRAMUSCULAR | Status: DC | PRN
  Administered 2014-02-11: 4 mg via INTRAVENOUS

## 2014-02-11 MED ORDER — FENTANYL CITRATE 0.05 MG/ML IJ SOLN
INTRAMUSCULAR | Status: AC
  Filled 2014-02-11: qty 5

## 2014-02-11 MED ORDER — EPINEPHRINE HCL 1 MG/ML IJ SOLN
1.0000 mg | Freq: Once | INTRAMUSCULAR | Status: AC
  Administered 2014-02-11: 2 mg via SUBCUTANEOUS
  Filled 2014-02-11: qty 1

## 2014-02-11 MED ORDER — MIDAZOLAM HCL 2 MG/2ML IJ SOLN
INTRAMUSCULAR | Status: AC
  Filled 2014-02-11: qty 2

## 2014-02-11 MED ORDER — PROMETHAZINE HCL 25 MG/ML IJ SOLN: 6.2500 mg | INTRAMUSCULAR | Status: DC | PRN

## 2014-02-11 MED ORDER — LACTATED RINGERS IV SOLN
INTRAVENOUS | Status: DC
  Administered 2014-02-11: 1000 mL via INTRAVENOUS

## 2014-02-11 MED ORDER — PROPOFOL 10 MG/ML IV BOLUS
INTRAVENOUS | Status: AC
  Filled 2014-02-11: qty 20

## 2014-02-11 MED ORDER — HYDROMORPHONE HCL PF 1 MG/ML IJ SOLN
0.2500 mg | INTRAMUSCULAR | Status: DC | PRN
  Administered 2014-02-11 (×4): 0.5 mg via INTRAVENOUS

## 2014-02-11 MED ORDER — LIDOCAINE HCL (CARDIAC) 20 MG/ML IV SOLN
INTRAVENOUS | Status: DC | PRN
  Administered 2014-02-11: 100 mg via INTRAVENOUS

## 2014-02-11 MED ORDER — HYDROMORPHONE HCL PF 1 MG/ML IJ SOLN
INTRAMUSCULAR | Status: AC
  Filled 2014-02-11: qty 1

## 2014-02-11 MED ORDER — LACTATED RINGERS IV SOLN: INTRAVENOUS | Status: DC

## 2014-02-11 MED ORDER — MIDAZOLAM HCL 5 MG/5ML IJ SOLN
INTRAMUSCULAR | Status: DC | PRN
  Administered 2014-02-11 (×2): 1 mg via INTRAVENOUS

## 2014-02-11 MED ORDER — CEFAZOLIN SODIUM-DEXTROSE 2-3 GM-% IV SOLR: 2.0000 g | INTRAVENOUS | Status: DC

## 2014-02-11 MED ORDER — EPINEPHRINE HCL 1 MG/ML IJ SOLN
INTRAMUSCULAR | Status: AC
  Filled 2014-02-11: qty 1

## 2014-02-11 MED ORDER — SODIUM CHLORIDE 0.9 % IR SOLN
Status: DC | PRN
  Administered 2014-02-11: 3000 mL

## 2014-02-11 MED ORDER — CHLORHEXIDINE GLUCONATE 4 % EX LIQD
60.0000 mL | Freq: Once | CUTANEOUS | Status: AC
  Administered 2014-02-11: 4 via TOPICAL
  Filled 2014-02-11: qty 60

## 2014-02-11 MED ORDER — SODIUM CHLORIDE 0.9 % IV SOLN: INTRAVENOUS | Status: DC

## 2014-02-11 MED ORDER — LIDOCAINE HCL (CARDIAC) 20 MG/ML IV SOLN
INTRAVENOUS | Status: AC
  Filled 2014-02-11: qty 5

## 2014-02-11 MED ORDER — VANCOMYCIN HCL 10 G IV SOLR
1250.0000 mg | Freq: Three times a day (TID) | INTRAVENOUS | Status: DC
  Administered 2014-02-11 – 2014-02-13 (×7): 1250 mg via INTRAVENOUS
  Filled 2014-02-11 (×9): qty 1250

## 2014-02-11 MED ORDER — FENTANYL CITRATE 0.05 MG/ML IJ SOLN
INTRAMUSCULAR | Status: DC | PRN
  Administered 2014-02-11 (×6): 50 ug via INTRAVENOUS
  Administered 2014-02-11: 100 ug via INTRAVENOUS

## 2014-02-11 MED ORDER — HYDROMORPHONE HCL PF 1 MG/ML IJ SOLN
1.0000 mg | INTRAMUSCULAR | Status: DC | PRN
  Administered 2014-02-11: 2 mg via INTRAVENOUS
  Administered 2014-02-11: 1 mg via INTRAVENOUS
  Administered 2014-02-12 – 2014-02-13 (×5): 2 mg via INTRAVENOUS
  Filled 2014-02-11 (×6): qty 2
  Filled 2014-02-11: qty 1

## 2014-02-11 SURGICAL SUPPLY — 67 items
BANDAGE CONFORM 3  STR LF (GAUZE/BANDAGES/DRESSINGS) IMPLANT
BANDAGE ELASTIC 3 VELCRO ST LF (GAUZE/BANDAGES/DRESSINGS) IMPLANT
BLADE DERMATOME SS (BLADE) ×3 IMPLANT
BLADE SURG 10 STRL SS (BLADE) IMPLANT
BNDG COHESIVE 1X5 TAN STRL LF (GAUZE/BANDAGES/DRESSINGS) IMPLANT
BNDG COHESIVE 4X5 TAN STRL (GAUZE/BANDAGES/DRESSINGS) IMPLANT
BNDG COHESIVE 6X5 TAN STRL LF (GAUZE/BANDAGES/DRESSINGS) IMPLANT
BNDG GAUZE STRTCH 6 (GAUZE/BANDAGES/DRESSINGS) IMPLANT
CORDS BIPOLAR (ELECTRODE) IMPLANT
COVER SURGICAL LIGHT HANDLE (MISCELLANEOUS) ×3 IMPLANT
CUFF TOURNIQUET SINGLE 24IN (TOURNIQUET CUFF) IMPLANT
CUFF TOURNIQUET SINGLE 34IN LL (TOURNIQUET CUFF) IMPLANT
CUFF TOURNIQUET SINGLE 44IN (TOURNIQUET CUFF) IMPLANT
DERMACARRIERS GRAFT 1 TO 1.5 (DISPOSABLE) ×3
DRAPE EXTREMITY BILATERAL (DRAPE) IMPLANT
DRAPE IMP U-DRAPE 54X76 (DRAPES) IMPLANT
DRAPE INCISE IOBAN 66X45 STRL (DRAPES) IMPLANT
DRAPE SURG 17X23 STRL (DRAPES) IMPLANT
DRAPE U-SHAPE 47X51 STRL (DRAPES) ×3 IMPLANT
DURAPREP 26ML APPLICATOR (WOUND CARE) IMPLANT
ELECT CAUTERY BLADE 6.4 (BLADE) IMPLANT
ELECT REM PT RETURN 9FT ADLT (ELECTROSURGICAL)
ELECTRODE REM PT RTRN 9FT ADLT (ELECTROSURGICAL) IMPLANT
FACESHIELD WRAPAROUND (MASK) IMPLANT
GAUZE XEROFORM 1X8 LF (GAUZE/BANDAGES/DRESSINGS) IMPLANT
GAUZE XEROFORM 5X9 LF (GAUZE/BANDAGES/DRESSINGS) ×3 IMPLANT
GLOVE BIOGEL PI IND STRL 6.5 (GLOVE) ×1 IMPLANT
GLOVE BIOGEL PI INDICATOR 6.5 (GLOVE) ×2
GLOVE SURG SS PI 7.0 STRL IVOR (GLOVE) ×3 IMPLANT
GLOVE SURG SS PI 7.5 STRL IVOR (GLOVE) ×6 IMPLANT
GOWN STRL REUS W/ TWL LRG LVL3 (GOWN DISPOSABLE) IMPLANT
GOWN STRL REUS W/ TWL XL LVL3 (GOWN DISPOSABLE) IMPLANT
GOWN STRL REUS W/TWL LRG LVL3 (GOWN DISPOSABLE) ×6 IMPLANT
GOWN STRL REUS W/TWL XL LVL3 (GOWN DISPOSABLE)
GRAFT DERMACARRIERS 1 TO 1.5 (DISPOSABLE) ×1 IMPLANT
HANDPIECE INTERPULSE COAX TIP (DISPOSABLE)
KIT BASIN OR (CUSTOM PROCEDURE TRAY) ×3 IMPLANT
KIT ROOM TURNOVER OR (KITS) IMPLANT
MANIFOLD NEPTUNE II (INSTRUMENTS) ×3 IMPLANT
NS IRRIG 1000ML POUR BTL (IV SOLUTION) ×3 IMPLANT
PACK ORTHO EXTREMITY (CUSTOM PROCEDURE TRAY) ×3 IMPLANT
PAD ABD 8X10 STRL (GAUZE/BANDAGES/DRESSINGS) ×3 IMPLANT
PAD ARMBOARD 7.5X6 YLW CONV (MISCELLANEOUS) IMPLANT
PADDING CAST ABS 4INX4YD NS (CAST SUPPLIES)
PADDING CAST ABS COTTON 4X4 ST (CAST SUPPLIES) IMPLANT
PADDING CAST COTTON 6X4 STRL (CAST SUPPLIES) IMPLANT
SET HNDPC FAN SPRY TIP SCT (DISPOSABLE) IMPLANT
SPONGE GAUZE 4X4 12PLY (GAUZE/BANDAGES/DRESSINGS) IMPLANT
SPONGE LAP 18X18 X RAY DECT (DISPOSABLE) IMPLANT
STOCKINETTE IMPERVIOUS 9X36 MD (GAUZE/BANDAGES/DRESSINGS) IMPLANT
SUT ETHILON 2 0 FS 18 (SUTURE) IMPLANT
SUT ETHILON 2 0 PSLX (SUTURE) IMPLANT
SUT ETHILON 3 0 PS 1 (SUTURE) IMPLANT
SUT MON AB 4-0 SH 27 (SUTURE) ×3 IMPLANT
SUT VIC AB 2-0 CT1 36 (SUTURE) IMPLANT
SUT VIC AB 2-0 FS1 27 (SUTURE) IMPLANT
SYR CONTROL 10ML LL (SYRINGE) IMPLANT
TOWEL OR 17X24 6PK STRL BLUE (TOWEL DISPOSABLE) IMPLANT
TOWEL OR 17X26 10 PK STRL BLUE (TOWEL DISPOSABLE) ×3 IMPLANT
TUBE ANAEROBIC SPECIMEN COL (MISCELLANEOUS) IMPLANT
TUBE CONNECTING 12'X1/4 (SUCTIONS)
TUBE CONNECTING 12X1/4 (SUCTIONS) IMPLANT
TUBE FEEDING 5FR 15 INCH (TUBING) IMPLANT
TUBING CYSTO DISP (UROLOGICAL SUPPLIES) ×3 IMPLANT
UNDERPAD 30X30 INCONTINENT (UNDERPADS AND DIAPERS) ×3 IMPLANT
WATER STERILE IRR 1000ML POUR (IV SOLUTION) ×3 IMPLANT
YANKAUER SUCT BULB TIP NO VENT (SUCTIONS) IMPLANT

## 2014-02-11 NOTE — Progress Notes (Signed)
ANTIBIOTIC CONSULT NOTE - FOLLOW UP  Pharmacy Consult for Vancomycin Indication: Sepsis secondary to cellulitis/soft tissue infection of left foot   No Known Allergies  Patient Measurements: Height: 5\' 7"  (170.2 cm) Weight: 170 lb (77.111 kg) IBW/kg (Calculated) : 66.1 Adjusted Body Weight:   Vital Signs: Temp: 98.4 F (36.9 C) (04/06 2130) Temp src: Oral (04/06 2130) BP: 144/80 mmHg (04/06 2130) Pulse Rate: 73 (04/06 2130) Intake/Output from previous day: 04/06 0701 - 04/07 0700 In: 240 [P.O.:240] Out: 1600 [Urine:1600] Intake/Output from this shift: Total I/O In: -  Out: 300 [Urine:300]  Labs:  Recent Labs  02/10/14 0542  CREATININE 1.09   Estimated Creatinine Clearance: 96 ml/min (by C-G formula based on Cr of 1.09).  Recent Labs  02/10/14 2105  VANCOTROUGH 9.2*     Microbiology: Recent Results (from the past 720 hour(s))  WOUND CULTURE     Status: None   Collection Time    02/01/14  9:48 AM      Result Value Ref Range Status   Specimen Description FOOT   Final   Special Requests Normal   Final   Gram Stain     Final   Value: NO WBC SEEN     RARE SQUAMOUS EPITHELIAL CELLS PRESENT     NO ORGANISMS SEEN     [facility]formed at [facility]   Culture     Final   Value: NO GROWTH 2 DAYS     Performed at [facility]   Report Status 02/03/2014 FINAL   Final  SURGICAL PCR SCREEN     Status: None   Collection Time    02/04/14  7:12 PM      Result Value Ref Range Status   MRSA, PCR NEGATIVE  NEGATIVE Final   Staphylococcus aureus NEGATIVE  NEGATIVE Final   Comment:            The Xpert SA Assay (FDA     approved for NASAL specimens     in patients over 27 years of age),     is one component of     a comprehensive surveillance     program.  Test performance has     been validated by [facility] for patients greater     than or equal to 27 year old.     It is not intended     to diagnose infection nor to     guide or monitor  treatment.  ANAEROBIC CULTURE     Status: None   Collection Time    02/04/14  8:38 PM      Result Value Ref Range Status   Specimen Description WOUND LEFT FOOT   Final   Special Requests PATIENT ON FOLLOWING ZOSYN VANCOMYCIN   Final   Gram Stain     Final   Value: ABUNDANT WBC PRESENT,BOTH PMN AND MONONUCLEAR     NO SQUAMOUS EPITHELIAL CELLS SEEN     ABUNDANT GRAM POSITIVE COCCI IN CLUSTERS     Performed at [facility]   Culture     Final   Value: NO ANAEROBES ISOLATED     Performed at [facility]   Report Status 02/09/2014 FINAL   Final  WOUND CULTURE     Status: None   Collection Time    02/04/14  8:38 PM      Result Value Ref Range Status   Specimen Description WOUND LEFT FOOT   Final   Special  Requests PATIENT ON FOLLOWING ZOSYN VANCOMYCIN   Final   Gram Stain     Final   Value: ABUNDANT WBC PRESENT,BOTH PMN AND MONONUCLEAR     NO SQUAMOUS EPITHELIAL CELLS SEEN     ABUNDANT GRAM POSITIVE COCCI IN CLUSTERS     Performed at [facility]   Culture     Final   Value: FEW METHICILLIN RESISTANT STAPHYLOCOCCUS AUREUS     Note: RIFAMPIN AND GENTAMICIN SHOULD NOT BE USED AS SINGLE DRUGS FOR TREATMENT OF STAPH INFECTIONS. This organism is presumed to be Clindamycin resistant based on detection of inducible Clindamycin resistance. CRITICAL RESULT CALLED TO, READ BACK BY AND      VERIFIED WITH: Justice Britain 02/07/13 0950 BY SMITHERSJ     Performed at [facility]   Report Status 02/07/2014 FINAL   Final   Organism ID, Bacteria METHICILLIN RESISTANT STAPHYLOCOCCUS AUREUS   Final    Anti-infectives   Start     Dose/Rate Route Frequency Ordered Stop   02/11/14 0600  ceFAZolin (ANCEF) IVPB 2 g/50 mL premix     2 g 100 mL/hr over 30 Minutes Intravenous On call to O.R. 02/10/14 1421 02/12/14 0559   02/11/14 0400  vancomycin (VANCOCIN) 1,250 mg in sodium chloride 0.9 % 250 mL IVPB     1,250 mg 166.7 mL/hr over 90 Minutes Intravenous Every 8 hours 02/11/14  0111     02/06/14 0600  vancomycin (VANCOCIN) IVPB 1000 mg/200 mL premix  Status:  Discontinued     1,000 mg 200 mL/hr over 60 Minutes Intravenous On call to O.R. 02/05/14 2255 02/05/14 2301   02/05/14 0400  vancomycin (VANCOCIN) IVPB 1000 mg/200 mL premix  Status:  Discontinued     1,000 mg 200 mL/hr over 60 Minutes Intravenous Every 8 hours 02/04/14 1959 02/11/14 0111   02/04/14 2000  vancomycin (VANCOCIN) 1,500 mg in sodium chloride 0.9 % 500 mL IVPB     1,500 mg 250 mL/hr over 120 Minutes Intravenous  Once 02/04/14 1958 02/04/14 2032   02/01/14 2000  vancomycin (VANCOCIN) IVPB 1000 mg/200 mL premix  Status:  Discontinued     1,000 mg 200 mL/hr over 60 Minutes Intravenous Every 12 hours 02/01/14 1403 02/04/14 1956   02/01/14 1800  piperacillin-tazobactam (ZOSYN) IVPB 3.375 g  Status:  Discontinued     3.375 g 12.5 mL/hr over 240 Minutes Intravenous 3 times per day 02/01/14 1351 02/07/14 1346   02/01/14 1030  piperacillin-tazobactam (ZOSYN) IVPB 3.375 g     3.375 g 100 mL/hr over 30 Minutes Intravenous  Once 02/01/14 0950 02/01/14 1202   02/01/14 1000  vancomycin (VANCOCIN) IVPB 1000 mg/200 mL premix     1,000 mg 200 mL/hr over 60 Minutes Intravenous  Once 02/01/14 0950 02/01/14 1239      Assessment: Patient with noted sepsis due to cellulitis.  Vancomycin trough drawn a little late on q8hr dosing, but lower then prior goal 10-15.  Will change goal for vancomycin now that sepsis noted in MD note, to 15-20.   Goal of Therapy:  Vancomycin trough level 15-20 mcg/ml  Plan:  Measure antibiotic drug levels at steady state Follow up culture results Change vancomycin to 1250mg  iv q8hr  Darlina Guys, Akhila Mahnken Crowford 02/11/2014,1:11 AM

## 2014-02-11 NOTE — Anesthesia Procedure Notes (Signed)
Procedure Name: LMA Insertion Date/Time: 02/11/2014 12:04 PM Performed by: Edison PaceGRAY, Jason Duran Pre-anesthesia Checklist: Patient identified, Patient being monitored, Timeout performed, Emergency Drugs available and Suction available Patient Re-evaluated:Patient Re-evaluated prior to inductionOxygen Delivery Method: Circle system utilized Preoxygenation: Pre-oxygenation with 100% oxygen Intubation Type: IV induction Number of attempts: 1 Placement Confirmation: positive ETCO2 Tube secured with: Tape Dental Injury: Teeth and Oropharynx as per pre-operative assessment

## 2014-02-11 NOTE — Anesthesia Preprocedure Evaluation (Signed)
Anesthesia Evaluation  Patient identified by MRN, date of birth, ID band Patient awake    Reviewed: Allergy & Precautions, H&P , NPO status , Patient's Chart, lab work & pertinent test results  Airway Mallampati: II TM Distance: >3 FB Neck ROM: Full    Dental no notable dental hx. (+) Teeth Intact, Dental Advisory Given   Pulmonary Current Smoker,  breath sounds clear to auscultation  Pulmonary exam normal       Cardiovascular negative cardio ROS  Rhythm:Regular Rate:Normal     Neuro/Psych negative neurological ROS  negative psych ROS   GI/Hepatic negative GI ROS, Neg liver ROS,   Endo/Other  negative endocrine ROS  Renal/GU negative Renal ROS  negative genitourinary   Musculoskeletal negative musculoskeletal ROS (+)   Abdominal   Peds  Hematology negative hematology ROS (+)   Anesthesia Other Findings   Reproductive/Obstetrics negative OB ROS                           Anesthesia Physical Anesthesia Plan  ASA: II  Anesthesia Plan: General   Post-op Pain Management:    Induction: Intravenous  Airway Management Planned: LMA  Additional Equipment:   Intra-op Plan:   Post-operative Plan: Extubation in OR  Informed Consent: I have reviewed the patients History and Physical, chart, labs and discussed the procedure including the risks, benefits and alternatives for the proposed anesthesia with the patient or authorized representative who has indicated his/her understanding and acceptance.   Dental advisory given  Plan Discussed with: CRNA  Anesthesia Plan Comments:         Anesthesia Quick Evaluation

## 2014-02-11 NOTE — Progress Notes (Signed)
Patient received from PACU, alert and oriented.  Left foot elevated on pillow, VAC dressing in place at 75mm hg, left thigh dressing clean dry and intact, able to wiggle toes, no complaints of pain at this time.  Given gingerale to drink without nausea.  IV left bicep has NS infusing at 13625ml/hour, patient urinated after arrival to unit.

## 2014-02-11 NOTE — Progress Notes (Addendum)
Reviewed settings on VAC, found setting on 125mm Hg, decreased to 100mm HG as per VAC order by MD.  Left foot slightly edematous but painful at this time.  Patient also states that his thumb and index finger are tingling since PICC insertion and site remains uncomfortable.  IV Therapy paged  Dr Blake DivineAkula notified

## 2014-02-11 NOTE — Op Note (Signed)
   Date of Surgery: 02/11/2014  INDICATIONS: Mr. Jason Duran is a 28 y.o.-year-old male who returns today for repeat irrigation and debridement and skin grafting of wound ;  The patient did consent to the procedure after discussion of the risks and benefits.  PREOPERATIVE DIAGNOSIS: left foot wound  POSTOPERATIVE DIAGNOSIS: Same.  PROCEDURE: 1. Surgical preparation of recipient skin site 2. Split thickness skin graft 3 x 2 cm 3. Application of negative wound therapy <50 sq cm.  SURGEON: N. Glee ArvinMichael Williette Duran, M.D.  ASSIST: none.  ANESTHESIA:  general  IV FLUIDS AND URINE: See anesthesia.  ESTIMATED BLOOD LOSS: minimal mL.  IMPLANTS: none  COMPLICATIONS: None.  DESCRIPTION OF PROCEDURE: The patient was brought to the operating room and placed supine on the operating table.  The patient had been signed prior to the procedure and this was documented. The patient had the anesthesia placed by the anesthesiologist.  A time-out was performed to confirm that this was the correct patient, site, side and location. The patient had an SCD on the opposite lower extremity. The patient did receive antibiotics prior to the incision and was re-dosed during the procedure as needed at indicated intervals.  The patient had the operative extremity prepped and draped in the standard surgical fashion.    The left foot wound was inspected for any worsening and appeared to be healthy and viable.  The wound bed was surgically prepared for skin grafting with a rasp and lavaged with normal saline.  A dermatome was then used to obtain a 10/999 inch skin graft from the left thigh.  This graft was meshed 1.5:1.  The graft was then sutured to the wound using a running 4.0 monocryl suture.  A piece of adaptic was placed on the graft and a VAC was placed over top of this.  This was set to -75 mmHg.  The patient was extubated and transferred to the PACU in stable condition.  POSTOPERATIVE PLAN:  The patient will require a VAC for the  next 5-7 days after which it will be taken off to judge graft acceptance.  He is to be non weight bearing to the left foot.  He may be discharged home with a home VAC and return back to the office for removal of the VAC.    Mayra ReelN. Jason Caius Silbernagel, MD [facility] [phone] 7:23 PM

## 2014-02-11 NOTE — Progress Notes (Signed)
Spoke with patient regarding schedule time for surgery, chg bath, wearing only patient gown, no personal items to be worn to surgery.

## 2014-02-11 NOTE — Progress Notes (Signed)
PROGRESS NOTE   Jason Duran FFM:[id number] DOB: Oct 05, 1986 DOA: 02/01/2014 PCP: No primary provider on file.  Brief narrative: Jason Duran is an 28 y.o. male with no significant PMH who was admitted 02/01/14 with worsening left foot erythema, swelling and pain. Patient was treated as an outpatient for cellulitis with Bactrim and Keflex, and despite outpatient therapy, the patient's condition continued to worsen and he developed blistering.  Ortho consulted and he was taken to OR multiple times over the last one week and he went to skin graft today. Wound vac placed .  Assessment/Plan: Principal Problem:   Sepsis secondary to cellulitis/soft tissue infection of left foot Patient met criteria for SIRS with an elevated heart rate and a WBC of 16.1. With left foot cellulitis, he meets the criteria for sepsis. He failed outpatient therapy with Bactrim and Keflex. Wound cultures were sent and the patient was placed on empiric vancomycin and Zosyn. ESR not elevated CRP 14.8. No current evidence of osteomyelitis based on CT scan/plain films done on admission.  Treat underlying onychomycosis.  Spoke with Dr. Erlinda Hong, who saw the patient in consultation since he has developed an abscess.  MRI negative for underlying osteomyelitis.  WBC coming down on empiric antibiotics, hemodynamically stable and non-toxic appearing, s/p I& D on 3/31. He underwent repeat I&D on 4/3. His wound cultures grew MRSA, he is continued on iv VANCOMYCIN and zosyn discontinued.  Pain control with IV pain medications.  Plan for skin graft in am.      Leukocytosis Secondary to infection. WBC normalized on broad-spectrum antibiotics.   Hyperkalemia Resolved with IVF.   Onychomycosis Lamisil cream BID ordered.  Constipation: Stool softners and miralax.    DVT Prophylaxis Continue Heparin.  Code Status: Full. Family Communication: family at bedside , discussed the plan of care.  Disposition Plan: pending further  evaluation.    IV access:  Peripheral IV  Medical Consultants:  Dr. Erlinda Hong, Orthopedics  Other Consultants:  None  Anti-infectives:  Vancomycin 02/01/14--->  Zosyn 02/01/14--->   HPI/Subjective: Jason Duran feels better today than yesterday. Pain better controlled.  Objective: Filed Vitals:   02/11/14 1457 02/11/14 1523 02/11/14 1700 02/11/14 1811  BP: 149/80 147/87 152/89 125/63  Pulse: 75 68 72 74  Temp: 97.9 F (36.6 C) 97.1 F (36.2 C) 98.2 F (36.8 C) 98.3 F (36.8 C)  TempSrc:  Oral Oral Oral  Resp: _0 Height:      Weight:      SpO2: 97% 100% 99% 98%    Intake/Output Summary (Last 24 hours) at 02/11/14 1822 Last data filed at 02/11/14 1811  Gross per 24 hour  Intake 3574.58 ml  Output   2100 ml  Net 1474.58 ml    Exam: Gen:  NAD Cardiovascular:  RRR, No M/R/G Respiratory:  Lungs CTAB Gastrointestinal:  Abdomen soft, NT/ND, + BS Extremities:  Erythema and swelling to left foot as pictured below.  Nails thickened with skin changes consistent with dermatophyte infection. Left foot: wound vac placed on .   Data Reviewed: Basic Metabolic Panel:  Recent Labs Lab 02/07/14 0500 02/10/14 0542  NA 138 139  K 4.0 5.0  CL 100 99  CO2 28 31  GLUCOSE 102* 116*  BUN 14 22  CREATININE 1.09 1.09  CALCIUM 8.9 9.5   GFR Estimated Creatinine Clearance: 96 ml/min (by C-G formula based on Cr of 1.09). Liver Function Tests: No results found for this basename: AST, ALT, ALKPHOS, BILITOT, PROT,  ALBUMIN,  in the last 168 hours CBC:  Recent Labs Lab 02/07/14 0500  WBC 8.6  HGB 15.2  HCT 44.0  MCV 89.1  PLT 247   Microbiology Recent Results (from the past 240 hour(s))  SURGICAL PCR SCREEN     Status: None   Collection Time    02/04/14  7:12 PM      Result Value Ref Range Status   MRSA, PCR NEGATIVE  NEGATIVE Final   Staphylococcus aureus NEGATIVE  NEGATIVE Final   Comment:            The Xpert SA Assay (FDA     approved for NASAL  specimens     in patients over 14 years of age),     is one component of     a comprehensive surveillance     program.  Test performance has     been validated by [employer] for patients greater     than or equal to 44 year old.     It is not intended     to diagnose infection nor to     guide or monitor treatment.  ANAEROBIC CULTURE     Status: None   Collection Time    02/04/14  8:38 PM      Result Value Ref Range Status   Specimen Description WOUND LEFT FOOT   Final   Special Requests PATIENT ON FOLLOWING ZOSYN VANCOMYCIN   Final   Gram Stain     Final   Value: ABUNDANT WBC PRESENT,BOTH PMN AND MONONUCLEAR     NO SQUAMOUS EPITHELIAL CELLS SEEN     ABUNDANT GRAM POSITIVE COCCI IN CLUSTERS     Performed at [facility]   Culture     Final   Value: NO ANAEROBES ISOLATED     Performed at [facility]   Report Status 02/09/2014 FINAL   Final  WOUND CULTURE     Status: None   Collection Time    02/04/14  8:38 PM      Result Value Ref Range Status   Specimen Description WOUND LEFT FOOT   Final   Special Requests PATIENT ON FOLLOWING ZOSYN VANCOMYCIN   Final   Gram Stain     Final   Value: ABUNDANT WBC PRESENT,BOTH PMN AND MONONUCLEAR     NO SQUAMOUS EPITHELIAL CELLS SEEN     ABUNDANT GRAM POSITIVE COCCI IN CLUSTERS     Performed at [facility]   Culture     Final   Value: FEW METHICILLIN RESISTANT STAPHYLOCOCCUS AUREUS     Note: RIFAMPIN AND GENTAMICIN SHOULD NOT BE USED AS SINGLE DRUGS FOR TREATMENT OF STAPH INFECTIONS. This organism is presumed to be Clindamycin resistant based on detection of inducible Clindamycin resistance. CRITICAL RESULT CALLED TO, READ BACK BY AND      VERIFIED WITH: Gerilyn Nestle 02/07/13 0950 BY SMITHERSJ     Performed at [facility]   Report Status 02/07/2014 FINAL   Final   Organism ID, Bacteria METHICILLIN RESISTANT STAPHYLOCOCCUS AUREUS   Final     Procedures and Diagnostic Studies: Ct Foot Left W  Contrast  02/01/2014   CLINICAL DATA:  Left foot pain and swelling.  Suspected insect bite.  EXAM: CT OF THE LEFT FOOT WITH CONTRAST  TECHNIQUE: Multidetector CT imaging was performed following the standard protocol during bolus administration of intravenous contrast.  CONTRAST:  131m OMNIPAQUE IOHEXOL 300 MG/ML  SOLN  COMPARISON:  DG FOOT COMPLETE*L* dated 02/01/2014  FINDINGS: Considerable abnormal dorsal soft tissue swelling in the foot is. No drainable abscess. This tracks back to the lateral ankle.  No bony destructive findings characteristic of osteomyelitis. No significant arthropathy in the foot.  No malalignment at the Lisfranc joint.  IMPRESSION: Extensive dorsal subcutaneous edema in the foot tracking into the lateral ankle. No abscess or bony destructive findings characteristic of osteomyelitis noted.   Electronically Signed   By: Sherryl Barters M.D.   On: 02/01/2014 13:50   Dg Foot Complete Left  02/01/2014   CLINICAL DATA:  Redness and swelling overlying the lateral aspect of the foot.  EXAM: LEFT FOOT - COMPLETE 3+ VIEW  COMPARISON:  None.  FINDINGS: Normal anatomic alignment. No evidence for acute fracture or dislocation. Regional soft tissues demonstrate mild swelling about the forefoot.  IMPRESSION: Mild soft tissue swelling about the forefoot.  No evidence for underlying bony abnormality.   Electronically Signed   By: Lovey Newcomer M.D.   On: 02/01/2014 11:22    Scheduled Meds: . heparin  5,000 Units Subcutaneous 3 times per day  . HYDROmorphone      . HYDROmorphone      . polyethylene glycol  17 g Oral Daily  . senna-docusate  2 tablet Oral BID  . terbinafine   Topical BID  . vancomycin  1,250 mg Intravenous Q8H   Continuous Infusions:    Time spent: 25 minutes.    LOS: 10 days   [facility] Pager [phone].   02/11/2014, 6:22 PM

## 2014-02-11 NOTE — Transfer of Care (Signed)
Immediate Anesthesia Transfer of Care Note  Patient: Jason Duran  Procedure(s) Performed: Procedure(s): IRRIGATION AND DEBRIDEMENT LEFT FOOT, SKIN GRAFT TAKEN FROM LEFT UPPER THIGH (Left)  Patient Location: PACU  Anesthesia Type:General  Level of Consciousness: awake, alert , oriented and patient cooperative  Airway & Oxygen Therapy: Patient Spontanous Breathing and Patient connected to face mask oxygen  Post-op Assessment: Report given to PACU RN, Post -op Vital signs reviewed and stable and Patient moving all extremities  Post vital signs: Reviewed and stable  Complications: No apparent anesthesia complications

## 2014-02-11 NOTE — H&P (Signed)
H&P update  The surgical history has been reviewed and remains accurate without interval change.  The patient was re-examined and patient's physiologic condition has not changed significantly in the last 30 days. The condition still exists that makes this procedure necessary. The treatment plan remains the same, without new options for care.  No new pharmacological allergies or types of therapy has been initiated that would change the plan or the appropriateness of the plan.  The patient and/or family understand the potential benefits and risks.  Mayra ReelN. Michael Kiera Hussey, MD 02/11/2014 7:55 AM

## 2014-02-12 ENCOUNTER — Encounter (HOSPITAL_COMMUNITY): Payer: Self-pay | Admitting: Orthopaedic Surgery

## 2014-02-12 LAB — BASIC METABOLIC PANEL
BUN: 14 mg/dL (ref 6–23)
CO2: 25 mEq/L (ref 19–32)
Calcium: 8.4 mg/dL (ref 8.4–10.5)
Chloride: 101 mEq/L (ref 96–112)
Creatinine, Ser: 0.82 mg/dL (ref 0.50–1.35)
Glucose, Bld: 91 mg/dL (ref 70–99)
POTASSIUM: 4.1 meq/L (ref 3.7–5.3)
Sodium: 136 mEq/L — ABNORMAL LOW (ref 137–147)

## 2014-02-12 LAB — CBC
HCT: 42.2 % (ref 39.0–52.0)
Hemoglobin: 14.7 g/dL (ref 13.0–17.0)
MCH: 30.4 pg (ref 26.0–34.0)
MCHC: 34.8 g/dL (ref 30.0–36.0)
MCV: 87.4 fL (ref 78.0–100.0)
PLATELETS: 209 10*3/uL (ref 150–400)
RBC: 4.83 MIL/uL (ref 4.22–5.81)
RDW: 12.7 % (ref 11.5–15.5)
WBC: 5.4 10*3/uL (ref 4.0–10.5)

## 2014-02-12 LAB — VANCOMYCIN, TROUGH: Vancomycin Tr: 16 ug/mL (ref 10.0–20.0)

## 2014-02-12 MED ORDER — OXYCODONE HCL 5 MG PO TABS
5.0000 mg | ORAL_TABLET | ORAL | Status: DC | PRN
  Administered 2014-02-12 (×3): 10 mg via ORAL
  Administered 2014-02-13: 5 mg via ORAL
  Administered 2014-02-13 (×2): 10 mg via ORAL
  Administered 2014-02-13: 5 mg via ORAL
  Filled 2014-02-12 (×6): qty 2

## 2014-02-12 NOTE — Progress Notes (Signed)
PHARMACY BRIEF NOTE - Drug Level Result  Consult for:  Vancomycin Indication:  L foot cellulitis with abscess and sepsis  The current dose of Vancomycin is 1250 mg IV every 8 hours.  A trough level obtained prior to the 8 pm dose tonight is reported as 16 mcg/ml.  This level is within the therapeutic range, 15-20 mcg/ml.    Plan: Continue the current Vancomycin dose.  Polo Rileylaire Fraidy Mccarrick R.Ph. 02/12/2014 10:51 PM

## 2014-02-12 NOTE — Progress Notes (Signed)
PROGRESS NOTE   PARRY PO YHC:[id number] DOB: [birth year] DOA: 02/01/2014 PCP: No primary provider on file.  Brief narrative: Jason Duran is an 28 y.o. male with no significant PMH who was admitted 02/01/14 with worsening left foot erythema, swelling and pain. Patient was treated as an outpatient for cellulitis with Bactrim and Keflex, and despite outpatient therapy, the patient's condition continued to worsen and he developed blistering. Ortho consulted and he was taken to OR multiple times for abscess. He then underwent skin grafting. Wound vac was also placed.   Assessment/Plan: Sepsis secondary to cellulitis/Abscess of left foot Patient initially met criteria for SIRS with an elevated heart rate and a WBC of 16.1. With left foot cellulitis, he meets the criteria for sepsis. He failed outpatient therapy with Bactrim and Keflex. Wound cultures were sent and the patient was placed on empiric vancomycin and Zosyn. ESR not elevated CRP 14.8. No evidence of osteomyelitis based on CT scan/plain films done on admission.  Treat underlying onychomycosis. Ortho was consulted (Dr. Erlinda Hong). MRi was done which revealed a left foot abscess. He underwent I&D on 3/31. WBC trended down on empiric antibiotics, hemodynamically stable and non-toxic appearing. He underwent repeat I&D on 4/3. His wound cultures grew MRSA. Per Ortho he will need IV antibiotics. PICC was placed a few days earlier but he developed paresthesias in fingers and so it was removed. PICC to placed again today. He will need IV Vanc at home. Duration to be determined by Dr. Erlinda Hong. He also underwent skin grafting on 4/7. Transition to oral narcotics.  Leukocytosis Secondary to infection. WBC normalized on broad-spectrum antibiotics.  Hyperkalemia Resolved with IVF.  Onychomycosis Lamisil cream BID ordered.  Constipation Stool softners and miralax.   DVT Prophylaxis Continue Heparin.  Code Status: Full. Family Communication:  Discussed the plan of care with patient. Disposition Plan: PT/OT eval as patient to be NWB on left. Anticipate DC in AM.   IV access:  Peripheral IV  Medical Consultants:  Dr. Erlinda Hong, Orthopedics  Other Consultants:  None  Anti-infectives:  Vancomycin 02/01/14--->  Zosyn 02/01/14 -->4/2   HPI/Subjective: He feels fine. Pain is at 4/10. No other symptoms.   Objective: Filed Vitals:   02/11/14 1811 02/11/14 1900 02/11/14 2130 02/12/14 0500  BP: 125/63 130/75 152/81 148/87  Pulse: 74 81 73 65  Temp: 98.3 F (36.8 C) 97.9 F (36.6 C) 98.2 F (36.8 C) 97.9 F (36.6 C)  TempSrc: Oral Oral Oral Oral  Resp: '18 20 18 18  ' Height:      Weight:      SpO2: 98% 99% 100% 98%    Intake/Output Summary (Last 24 hours) at 02/12/14 0953 Last data filed at 02/12/14 0500  Gross per 24 hour  Intake   1620 ml  Output   1950 ml  Net   -330 ml    Exam: Gen:  NAD Cardiovascular:  RRR, No M/R/G Respiratory:  Lungs CTAB Gastrointestinal:  Abdomen soft, NT/ND, + BS Extremities:  Left foot not as swollen. Good pulses. Wound vac noted.  No focal deficits   Data Reviewed: Basic Metabolic Panel:  Recent Labs Lab 02/07/14 0500 02/10/14 0542 02/12/14 0600  NA 138 139 136*  K 4.0 5.0 4.1  CL 100 99 101  CO2 '28 31 25  ' GLUCOSE 102* 116* 91  BUN '14 22 14  ' CREATININE 1.09 1.09 0.82  CALCIUM 8.9 9.5 8.4   CBC:  Recent Labs Lab 02/07/14 0500 02/12/14 0600  WBC 8.6 5.4  HGB 15.2 14.7  HCT 44.0 42.2  MCV 89.1 87.4  PLT 247 209   Microbiology Recent Results (from the past 240 hour(s))  SURGICAL PCR SCREEN     Status: None   Collection Time    02/04/14  7:12 PM      Result Value Ref Range Status   MRSA, PCR NEGATIVE  NEGATIVE Final   Staphylococcus aureus NEGATIVE  NEGATIVE Final   Comment:            The Xpert SA Assay (FDA     approved for NASAL specimens     in patients over 66 years of age),     is one component of     a comprehensive surveillance     program.   Test performance has     been validated by [employer] for patients greater     than or equal to 43 year old.     It is not intended     to diagnose infection nor to     guide or monitor treatment.  ANAEROBIC CULTURE     Status: None   Collection Time    02/04/14  8:38 PM      Result Value Ref Range Status   Specimen Description WOUND LEFT FOOT   Final   Special Requests PATIENT ON FOLLOWING ZOSYN VANCOMYCIN   Final   Gram Stain     Final   Value: ABUNDANT WBC PRESENT,BOTH PMN AND MONONUCLEAR     NO SQUAMOUS EPITHELIAL CELLS SEEN     ABUNDANT GRAM POSITIVE COCCI IN CLUSTERS     Performed at [facility]   Culture     Final   Value: NO ANAEROBES ISOLATED     Performed at [facility]   Report Status 02/09/2014 FINAL   Final  WOUND CULTURE     Status: None   Collection Time    02/04/14  8:38 PM      Result Value Ref Range Status   Specimen Description WOUND LEFT FOOT   Final   Special Requests PATIENT ON FOLLOWING ZOSYN VANCOMYCIN   Final   Gram Stain     Final   Value: ABUNDANT WBC PRESENT,BOTH PMN AND MONONUCLEAR     NO SQUAMOUS EPITHELIAL CELLS SEEN     ABUNDANT GRAM POSITIVE COCCI IN CLUSTERS     Performed at [facility]   Culture     Final   Value: FEW METHICILLIN RESISTANT STAPHYLOCOCCUS AUREUS     Note: RIFAMPIN AND GENTAMICIN SHOULD NOT BE USED AS SINGLE DRUGS FOR TREATMENT OF STAPH INFECTIONS. This organism is presumed to be Clindamycin resistant based on detection of inducible Clindamycin resistance. CRITICAL RESULT CALLED TO, READ BACK BY AND      VERIFIED WITH: Gerilyn Nestle 02/07/13 0950 BY SMITHERSJ     Performed at [facility]   Report Status 02/07/2014 FINAL   Final   Organism ID, Bacteria METHICILLIN RESISTANT STAPHYLOCOCCUS AUREUS   Final     Procedures and Diagnostic Studies: Ct Foot Left W Contrast  02/01/2014   CLINICAL DATA:  Left foot pain and swelling.  Suspected insect bite.  EXAM: CT OF THE LEFT FOOT WITH CONTRAST   TECHNIQUE: Multidetector CT imaging was performed following the standard protocol during bolus administration of intravenous contrast.  CONTRAST:  146m OMNIPAQUE IOHEXOL 300 MG/ML  SOLN  COMPARISON:  DG FOOT COMPLETE*L* dated 02/01/2014  FINDINGS: Considerable abnormal dorsal soft tissue swelling in  the foot is. No drainable abscess. This tracks back to the lateral ankle.  No bony destructive findings characteristic of osteomyelitis. No significant arthropathy in the foot.  No malalignment at the Lisfranc joint.  IMPRESSION: Extensive dorsal subcutaneous edema in the foot tracking into the lateral ankle. No abscess or bony destructive findings characteristic of osteomyelitis noted.   Electronically Signed   By: Sherryl Barters M.D.   On: 02/01/2014 13:50   Dg Foot Complete Left  02/01/2014   CLINICAL DATA:  Redness and swelling overlying the lateral aspect of the foot.  EXAM: LEFT FOOT - COMPLETE 3+ VIEW  COMPARISON:  None.  FINDINGS: Normal anatomic alignment. No evidence for acute fracture or dislocation. Regional soft tissues demonstrate mild swelling about the forefoot.  IMPRESSION: Mild soft tissue swelling about the forefoot.  No evidence for underlying bony abnormality.   Electronically Signed   By: Lovey Newcomer M.D.   On: 02/01/2014 11:22    Scheduled Meds: . heparin  5,000 Units Subcutaneous 3 times per day  . polyethylene glycol  17 g Oral Daily  . senna-docusate  2 tablet Oral BID  . terbinafine   Topical BID  . vancomycin  1,250 mg Intravenous Q8H   Continuous Infusions:    Time spent: 25 minutes.    LOS: 11 days   [facility] Pager [phone]   02/12/2014, 9:53 AM

## 2014-02-12 NOTE — Progress Notes (Signed)
ANTIBIOTIC CONSULT NOTE - FOLLOW UP  Pharmacy Consult for  vancomycin Indication: L foot cellulitis with abscess and sepsis  No Known Allergies  Patient Measurements: Height: 5\' 7"  (170.2 cm) Weight: 170 lb (77.111 kg) IBW/kg (Calculated) : 66.1   Vital Signs: Temp: 97.9 F (36.6 C) (04/08 0500) Temp src: Oral (04/08 0500) BP: 148/87 mmHg (04/08 0500) Pulse Rate: 65 (04/08 0500) Intake/Output from previous day: 04/07 0701 - 04/08 0700 In: 1620 [P.O.:220; I.V.:1400] Out: 2350 [Urine:2350] Intake/Output from this shift: Total I/O In: -  Out: 700 [Urine:700]  Labs:  Recent Labs  02/10/14 0542 02/12/14 0600  WBC  --  5.4  HGB  --  14.7  PLT  --  209  CREATININE 1.09 0.82   Estimated Creatinine Clearance: 127.6 ml/min (by C-G formula based on Cr of 0.82).  Recent Labs  02/10/14 2105  VANCOTROUGH 9.2*     Microbiology: Recent Results (from the past 720 hour(s))  WOUND CULTURE     Status: None   Collection Time    02/01/14  9:48 AM      Result Value Ref Range Status   Specimen Description FOOT   Final   Special Requests Normal   Final   Gram Stain     Final   Value: NO WBC SEEN     RARE SQUAMOUS EPITHELIAL CELLS PRESENT     NO ORGANISMS SEEN     Performed at [facility]   Culture     Final   Value: NO GROWTH 2 DAYS     Performed at [facility]   Report Status 02/03/2014 FINAL   Final  SURGICAL PCR SCREEN     Status: None   Collection Time    02/04/14  7:12 PM      Result Value Ref Range Status   MRSA, PCR NEGATIVE  NEGATIVE Final   Staphylococcus aureus NEGATIVE  NEGATIVE Final   Comment:            The Xpert SA Assay (FDA     approved for NASAL specimens     in patients over 39 years of age),     is one component of     a comprehensive surveillance     program.  Test performance has     been validated by [employer] for patients greater     than or equal to 28 year old.     It is not intended     to diagnose infection  nor to     guide or monitor treatment.  ANAEROBIC CULTURE     Status: None   Collection Time    02/04/14  8:38 PM      Result Value Ref Range Status   Specimen Description WOUND LEFT FOOT   Final   Special Requests PATIENT ON FOLLOWING ZOSYN VANCOMYCIN   Final   Gram Stain     Final   Value: ABUNDANT WBC PRESENT,BOTH PMN AND MONONUCLEAR     NO SQUAMOUS EPITHELIAL CELLS SEEN     ABUNDANT GRAM POSITIVE COCCI IN CLUSTERS     Performed at [facility]   Culture     Final   Value: NO ANAEROBES ISOLATED     Performed at [facility]   Report Status 02/09/2014 FINAL   Final  WOUND CULTURE     Status: None   Collection Time    02/04/14  8:38 PM      Result  Value Ref Range Status   Specimen Description WOUND LEFT FOOT   Final   Special Requests PATIENT ON FOLLOWING ZOSYN VANCOMYCIN   Final   Gram Stain     Final   Value: ABUNDANT WBC PRESENT,BOTH PMN AND MONONUCLEAR     NO SQUAMOUS EPITHELIAL CELLS SEEN     ABUNDANT GRAM POSITIVE COCCI IN CLUSTERS     Performed at [facility]   Culture     Final   Value: FEW METHICILLIN RESISTANT STAPHYLOCOCCUS AUREUS     Note: RIFAMPIN AND GENTAMICIN SHOULD NOT BE USED AS SINGLE DRUGS FOR TREATMENT OF STAPH INFECTIONS. This organism is presumed to be Clindamycin resistant based on detection of inducible Clindamycin resistance. CRITICAL RESULT CALLED TO, READ BACK BY AND      VERIFIED WITH: Justice Britain 02/07/13 0950 BY SMITHERSJ     Performed at [facility]   Report Status 02/07/2014 FINAL   Final   Organism ID, Bacteria METHICILLIN RESISTANT STAPHYLOCOCCUS AUREUS   Final    Anti-infectives   Start     Dose/Rate Route Frequency Ordered Stop   02/11/14 0730  ceFAZolin (ANCEF) IVPB 2 g/50 mL premix  Status:  Discontinued     2 g 100 mL/hr over 30 Minutes Intravenous On call to O.R. 02/11/14 0723 02/11/14 1458   02/11/14 0600  ceFAZolin (ANCEF) IVPB 2 g/50 mL premix     2 g 100 mL/hr over 30 Minutes Intravenous On call  to O.R. 02/10/14 1421 02/11/14 1200   02/11/14 0400  vancomycin (VANCOCIN) 1,250 mg in sodium chloride 0.9 % 250 mL IVPB     1,250 mg 166.7 mL/hr over 90 Minutes Intravenous Every 8 hours 02/11/14 0111     02/06/14 0600  vancomycin (VANCOCIN) IVPB 1000 mg/200 mL premix  Status:  Discontinued     1,000 mg 200 mL/hr over 60 Minutes Intravenous On call to O.R. 02/05/14 2255 02/05/14 2301   02/05/14 0400  vancomycin (VANCOCIN) IVPB 1000 mg/200 mL premix  Status:  Discontinued     1,000 mg 200 mL/hr over 60 Minutes Intravenous Every 8 hours 02/04/14 1959 02/11/14 0111   02/04/14 2000  vancomycin (VANCOCIN) 1,500 mg in sodium chloride 0.9 % 500 mL IVPB     1,500 mg 250 mL/hr over 120 Minutes Intravenous  Once 02/04/14 1958 02/04/14 2032   02/01/14 2000  vancomycin (VANCOCIN) IVPB 1000 mg/200 mL premix  Status:  Discontinued     1,000 mg 200 mL/hr over 60 Minutes Intravenous Every 12 hours 02/01/14 1403 02/04/14 1956   02/01/14 1800  piperacillin-tazobactam (ZOSYN) IVPB 3.375 g  Status:  Discontinued     3.375 g 12.5 mL/hr over 240 Minutes Intravenous 3 times per day 02/01/14 1351 02/07/14 1346   02/01/14 1030  piperacillin-tazobactam (ZOSYN) IVPB 3.375 g     3.375 g 100 mL/hr over 30 Minutes Intravenous  Once 02/01/14 0950 02/01/14 1202   02/01/14 1000  vancomycin (VANCOCIN) IVPB 1000 mg/200 mL premix     1,000 mg 200 mL/hr over 60 Minutes Intravenous  Once 02/01/14 0950 02/01/14 1239      Assessment: 28 y/o M presented to ED on 3/28 with L foot swelling, erythema, and pain that were worsening despite oral Bactrim and Keflex.  Had positive SIRS indicators and was admitted with sepsis.  Orders were received for vancomycin and Zosyn with pharmacy dosing assistance.  Patient subsequently required I&D of foot abscess with skin grafting.  Wound culture from 3/31 grew MRSA; antimicrobial regimen was  de-escalated to vancomycin monotherapy on 4/3.    Per ortho, patient will need to remain on IV  vancomycin for at least 6 weeks.    4/8: D#12 vancomycin, dosage now 1250 mg IV q8h  (increased to this on 4/7 based on trough result from 4/6 PM).  Afebrile, WBC WNL.  SCr stable, WNL.  Plans noted to begin transition toward discharge.     Goal of Therapy:  Appropriate antibiotic dosing for renal function; eradication of infection. Vancomycin trough 15-20   Plan:  1. Continue vancomycin 1250 mg IV q8h 2. Recheck vancomycin trough before tonight's 8 pm dose. 3. Follow serum creatinine, clinical course.  Elie Goodyandy Astella Desir, PharmD, BCPS Pager: [phone] 02/12/2014  11:42 AM

## 2014-02-12 NOTE — Progress Notes (Signed)
OT Cancellation Note  Patient Details Name: Jason RuddyBryant J Cournoyer MRN: [id number] DOB: 1985-11-23   Cancelled Treatment:    Reason Eval/Treat Not Completed: OT screened, no needs identified, will sign off.  Pt doesn't feel he needs OT.  He has been up in room and is used to being NWB.  Has assist at home.  Will sign off  [facility] Isabellarose Kope 02/12/2014, 4:14 PM Marica OtterMaryellen Charleston Hankin, OTR/L [phone] 02/12/2014

## 2014-02-12 NOTE — Anesthesia Postprocedure Evaluation (Signed)
  Anesthesia Post-op Note  Patient: Jason Duran  Procedure(s) Performed: Procedure(s) (LRB): IRRIGATION AND DEBRIDEMENT LEFT FOOT, SKIN GRAFT TAKEN FROM LEFT UPPER THIGH (Left)  Patient Location: PACU  Anesthesia Type: General  Level of Consciousness: awake and alert   Airway and Oxygen Therapy: Patient Spontanous Breathing  Post-op Pain: mild  Post-op Assessment: Post-op Vital signs reviewed, Patient's Cardiovascular Status Stable, Respiratory Function Stable, Patent Airway and No signs of Nausea or vomiting  Last Vitals:  Filed Vitals:   02/12/14 0500  BP: 148/87  Pulse: 65  Temp: 36.6 C  Resp: 18    Post-op Vital Signs: stable   Complications: No apparent anesthesia complications

## 2014-02-13 MED ORDER — SENNOSIDES-DOCUSATE SODIUM 8.6-50 MG PO TABS: 2.0000 | ORAL_TABLET | Freq: Two times a day (BID) | ORAL | Status: DC

## 2014-02-13 MED ORDER — METHOCARBAMOL 500 MG PO TABS: 500.0000 mg | ORAL_TABLET | Freq: Four times a day (QID) | ORAL | Status: DC | PRN

## 2014-02-13 MED ORDER — SODIUM CHLORIDE 0.9 % IJ SOLN
10.0000 mL | INTRAMUSCULAR | Status: DC | PRN
  Administered 2014-02-13: 10 mL

## 2014-02-13 MED ORDER — VANCOMYCIN HCL 10 G IV SOLR: 1250.0000 mg | Freq: Three times a day (TID) | INTRAVENOUS | Status: DC

## 2014-02-13 MED ORDER — TERBINAFINE HCL 1 % EX CREA
TOPICAL_CREAM | Freq: Two times a day (BID) | CUTANEOUS | Status: DC
Start: 2014-02-13 — End: 2014-06-20

## 2014-02-13 MED ORDER — OXYCODONE HCL 5 MG PO TABS: 5.0000 mg | ORAL_TABLET | ORAL | Status: DC | PRN

## 2014-02-13 MED ORDER — POLYETHYLENE GLYCOL 3350 17 G PO PACK: 17.0000 g | PACK | Freq: Every day | ORAL | Status: DC

## 2014-02-13 MED ORDER — HEPARIN SOD (PORK) LOCK FLUSH 100 UNIT/ML IV SOLN
250.0000 [IU] | INTRAVENOUS | Status: AC | PRN
  Administered 2014-02-13: 250 [IU]

## 2014-02-13 NOTE — Discharge Summary (Signed)
[facility]  Physician Discharge Summary   Patient ID: Jason Duran MRN: [id number] DOB/AGE: May 23, 1986 28 y.o.  Admit date: 02/01/2014 Discharge date: 02/13/2014  PCP: None  DISCHARGE DIAGNOSES:  Principal Problem:   Sepsis Active Problems:   Soft tissue infection of foot   Leukocytosis   Hyperkalemia   Cellulitis of left foot   Onychomycosis   RECOMMENDATIONS FOR OUTPATIENT FOLLOW UP: 1. Home health for wound vac, wound care, Iv Vancomycin  DISCHARGE CONDITION: fair  Diet recommendation: Regular  Filed Weights   02/01/14 1339  Weight: 77.111 kg (170 lb)    INITIAL HISTORY: Jason Duran is an 28 y.o. male with no significant PMH who was admitted 02/01/14 with worsening left foot erythema, swelling and pain. Patient was treated as an outpatient for cellulitis with Bactrim and Keflex, and despite outpatient therapy, the patient's condition continued to worsen and he developed blistering. Ortho consulted and he was taken to OR multiple times for abscess. He then underwent skin grafting. Wound vac was also placed.    Consultations:  Dr. Erlinda Hong with Ortho  Procedures:  Incision and Drainage of left foot abscess on 3/31 and 4/3 and Skin grafting on 4/7.   HOSPITAL COURSE:   Sepsis secondary to cellulitis/Abscess of left foot  Patient initially met criteria for SIRS with an elevated heart rate and a WBC of 16.1. With left foot cellulitis, he met the criteria for sepsis. He had failed outpatient therapy with Bactrim and Keflex. Wound cultures were sent and the patient was placed on empiric vancomycin and Zosyn. ESR not elevated but CRP was 14.8. No evidence of osteomyelitis based on CT scan/plain films done on admission. His underlying onychomycosis was treated. Ortho was consulted (Dr. Erlinda Hong). MRI was done which revealed a left foot abscess. He underwent I&D on 3/31. WBC trended down on empiric antibiotics, hemodynamically stable and non-toxic appearing. He underwent  repeat I&D on 4/3. His wound cultures grew MRSA. He has been determined to need IV Vancomycin. PICC was placed a few days earlier but he developed paresthesias in fingers and so it was removed. PICC has been placed again today. Duration of treatment to be determined by Dr. Erlinda Hong at follow up. He also underwent skin grafting on 4/7. Transition to oral narcotics. He ambulates using walker as he is non weight bearing on left. He was seen by PT as well.  Leukocytosis  Secondary to infection. WBC normalized on broad-spectrum antibiotics.   Hyperkalemia  Resolved with IVF.   Onychomycosis  Lamisil cream BID ordered.   Constipation  Stool softners and miralax.   Overall he is better. Case manager to arrange home vac for his wound. He should be able to go home following that.   PERTINENT LABS:  The results of significant diagnostics from this hospitalization (including imaging, microbiology, ancillary and laboratory) are listed below for reference.    Microbiology: Recent Results (from the past 240 hour(s))  SURGICAL PCR SCREEN     Status: None   Collection Time    02/04/14  7:12 PM      Result Value Ref Range Status   MRSA, PCR NEGATIVE  NEGATIVE Final   Staphylococcus aureus NEGATIVE  NEGATIVE Final   Comment:            The Xpert SA Assay (FDA     approved for NASAL specimens     in patients over 6 years of age),     is one component of     a comprehensive  surveillance     program.  Test performance has     been validated by [facility] for patients greater     than or equal to 74 year old.     It is not intended     to diagnose infection nor to     guide or monitor treatment.  ANAEROBIC CULTURE     Status: None   Collection Time    02/04/14  8:38 PM      Result Value Ref Range Status   Specimen Description WOUND LEFT FOOT   Final   Special Requests PATIENT ON FOLLOWING ZOSYN VANCOMYCIN   Final   Gram Stain     Final   Value: ABUNDANT WBC PRESENT,BOTH PMN AND MONONUCLEAR      NO SQUAMOUS EPITHELIAL CELLS SEEN     ABUNDANT GRAM POSITIVE COCCI IN CLUSTERS     Performed at [facility]   Culture     Final   Value: NO ANAEROBES ISOLATED     Performed at [facility]   Report Status 02/09/2014 FINAL   Final  WOUND CULTURE     Status: None   Collection Time    02/04/14  8:38 PM      Result Value Ref Range Status   Specimen Description WOUND LEFT FOOT   Final   Special Requests PATIENT ON FOLLOWING ZOSYN VANCOMYCIN   Final   Gram Stain     Final   Value: ABUNDANT WBC PRESENT,BOTH PMN AND MONONUCLEAR     NO SQUAMOUS EPITHELIAL CELLS SEEN     ABUNDANT GRAM POSITIVE COCCI IN CLUSTERS     Performed at [facility]   Culture     Final   Value: FEW METHICILLIN RESISTANT STAPHYLOCOCCUS AUREUS     Note: RIFAMPIN AND GENTAMICIN SHOULD NOT BE USED AS SINGLE DRUGS FOR TREATMENT OF STAPH INFECTIONS. This organism is presumed to be Clindamycin resistant based on detection of inducible Clindamycin resistance. CRITICAL RESULT CALLED TO, READ BACK BY AND      VERIFIED WITH: Gerilyn Nestle 02/07/13 0950 BY SMITHERSJ     Performed at [facility]   Report Status 02/07/2014 FINAL   Final   Organism ID, Bacteria METHICILLIN RESISTANT STAPHYLOCOCCUS AUREUS   Final     Labs: Basic Metabolic Panel:  Recent Labs Lab 02/07/14 0500 02/10/14 0542 02/12/14 0600  NA 138 139 136*  K 4.0 5.0 4.1  CL 100 99 101  CO2 '28 31 25  ' GLUCOSE 102* 116* 91  BUN '14 22 14  ' CREATININE 1.09 1.09 0.82  CALCIUM 8.9 9.5 8.4   CBC:  Recent Labs Lab 02/07/14 0500 02/12/14 0600  WBC 8.6 5.4  HGB 15.2 14.7  HCT 44.0 42.2  MCV 89.1 87.4  PLT 247 209    IMAGING STUDIES Ct Foot Left W Contrast  02/26/2014   CLINICAL DATA:  Left foot pain and swelling.  Suspected insect bite.  EXAM: CT OF THE LEFT FOOT WITH CONTRAST  TECHNIQUE: Multidetector CT imaging was performed following the standard protocol during bolus administration of intravenous contrast.  CONTRAST:   133m OMNIPAQUE IOHEXOL 300 MG/ML  SOLN  COMPARISON:  DG FOOT COMPLETE*L* dated 304/22/2015 FINDINGS: Considerable abnormal dorsal soft tissue swelling in the foot is. No drainable abscess. This tracks back to the lateral ankle.  No bony destructive findings characteristic of osteomyelitis. No significant arthropathy in the foot.  No malalignment at the Lisfranc joint.  IMPRESSION: Extensive dorsal  subcutaneous edema in the foot tracking into the lateral ankle. No abscess or bony destructive findings characteristic of osteomyelitis noted.   Electronically Signed   By: Sherryl Barters M.D.   On: 02/01/2014 13:50   Mr Foot Left W Wo Contrast  02/04/2014   CLINICAL DATA:  Worsening pain and swelling.  EXAM: MRI OF THE LEFT FOREFOOT WITHOUT AND WITH CONTRAST  TECHNIQUE: Multiplanar, multisequence MR imaging was performed both before and after administration of intravenous contrast.  CONTRAST:  70m MULTIHANCE GADOBENATE DIMEGLUMINE 529 MG/ML IV SOLN  COMPARISON:  CT scan 02/01/2014  FINDINGS: There is a large abscess on the dorsum of the forefoot overlying the lateral aspect of the distal metatarsals. It measures approximately 4.8 x 4.1 x 2.0 cm. There is significant surrounding cellulitis. No findings for myofasciitis or pyomyositis. No findings for septic arthritis or osteomyelitis.  IMPRESSION: Large abscess on the dorsum of the forefoot. Significant surrounding cellulitis.  No findings for septic arthritis or osteomyelitis.   Electronically Signed   By: MKalman JewelsM.D.   On: 02/04/2014 08:06   Dg Foot Complete Left  02/01/2014   CLINICAL DATA:  Redness and swelling overlying the lateral aspect of the foot.  EXAM: LEFT FOOT - COMPLETE 3+ VIEW  COMPARISON:  None.  FINDINGS: Normal anatomic alignment. No evidence for acute fracture or dislocation. Regional soft tissues demonstrate mild swelling about the forefoot.  IMPRESSION: Mild soft tissue swelling about the forefoot.  No evidence for underlying bony  abnormality.   Electronically Signed   By: DLovey NewcomerM.D.   On: 02/01/2014 11:22    DISCHARGE EXAMINATION: Filed Vitals:   02/12/14 0500 02/12/14 2156 02/13/14 0629 02/13/14 0900  BP: 148/87 136/85 128/72 132/84  Pulse: 65 77 72 85  Temp: 97.9 F (36.6 C) 99.5 F (37.5 C) 98.9 F (37.2 C) 98 F (36.7 C)  TempSrc: Oral Oral Oral Oral  Resp: '18 20 16 16  ' Height:      Weight:      SpO2: 98% 98% 96% 98%   General appearance: alert, cooperative, appears stated age and no distress Resp: clear to auscultation bilaterally Cardio: regular rate and rhythm, S1, S2 normal, no murmur, click, rub or gallop GI: soft, non-tender; bowel sounds normal; no masses,  no organomegaly Extremities: dressing over thigh from where skin was harvested for grafting. Wound vac on left foot.  DISPOSITION: Home with home health  Discharge Orders   Future Orders Complete By Expires   Call MD for:  persistant nausea and vomiting  As directed    Call MD for:  redness, tenderness, or signs of infection (pain, swelling, redness, odor or green/yellow discharge around incision site)  As directed    Call MD for:  temperature >100.4  As directed    Diet general  As directed    Discharge instructions  As directed    Increase activity slowly  As directed       ALLERGIES: No Known Allergies  Current Discharge Medication List    START taking these medications   Details  methocarbamol (ROBAXIN) 500 MG tablet Take 1 tablet (500 mg total) by mouth every 6 (six) hours as needed for muscle spasms. Qty: 30 tablet, Refills: 0    oxyCODONE (OXY IR/ROXICODONE) 5 MG immediate release tablet Take 1-2 tablets (5-10 mg total) by mouth every 4 (four) hours as needed for severe pain. Qty: 60 tablet, Refills: 0    polyethylene glycol (MIRALAX / GLYCOLAX) packet Take 17 g by mouth  daily. Qty: 30 each, Refills: 0    senna-docusate (SENOKOT-S) 8.6-50 MG per tablet Take 2 tablets by mouth 2 (two) times daily. Qty: 120  tablet, Refills: 0    terbinafine (LAMISIL) 1 % cream Apply topically 2 (two) times daily. Qty: 30 g, Refills: 0    vancomycin 1,250 mg in sodium chloride 0.9 % 250 mL Inject 1,250 mg into the vein every 8 (eight) hours. For 2 weeks Qty: 90 each, Refills: 0      CONTINUE these medications which have NOT CHANGED   Details  acetaminophen (TYLENOL) 500 MG tablet Take 1,000 mg by mouth every 6 (six) hours as needed for headache.    ibuprofen (ADVIL,MOTRIN) 600 MG tablet Take 1 tablet (600 mg total) by mouth every 6 (six) hours as needed. Qty: 30 tablet, Refills: 0    tetrahydrozoline 0.05 % ophthalmic solution Place 1 drop into both eyes as needed (dry eyes).      STOP taking these medications     cephALEXin (KEFLEX) 500 MG capsule      HYDROcodone-acetaminophen (NORCO/VICODIN) 5-325 MG per tablet      sulfamethoxazole-trimethoprim (SEPTRA DS) 800-160 MG per tablet        Follow-up Information   Follow up with Marianna Payment, MD On 02/17/2014. (call for appointment. Tell them you were hospitalized and seen by Dr. Erlinda Hong.)    Specialty:  Orthopedic Surgery   Contact information:   [place] [address] [phone]       TOTAL DISCHARGE TIME: 1 mins  Bonnielee Haff  [facility] Pager [phone]  02/13/2014, 10:00 AM

## 2014-02-13 NOTE — Evaluation (Signed)
Physical Therapy Evaluation Patient Details Name: Jason Duran MRN: [id number] DOB: 1986-06-07 Today's Date: 02/13/2014   History of Present Illness  s/p I&D L foot wound, skin graft and VAC placement 02/12/14  Clinical Impression  Pt mod I with RW, crutches. Needs RW to carrry VAC.      Follow Up Recommendations No PT follow up    Equipment Recommendations  Crutches;Rolling walker with 5" wheels    Recommendations for Other Services       Precautions / Restrictions Precautions Precautions: Fall Restrictions Weight Bearing Restrictions: Yes LLE Weight Bearing: Non weight bearing      Mobility  Bed Mobility Overal bed mobility: Independent                Transfers Overall transfer level: Modified independent Equipment used: Rolling walker (2 wheeled)                Ambulation/Gait Ambulation/Gait assistance: Modified independent (Device/Increase time);Supervision   Assistive device: Rolling walker (2 wheeled);Crutches Gait Pattern/deviations: Step-to pattern     General Gait Details: instruction on safe use of RW and crutches for turns , pt able to amintain NWB on L  Stairs Stairs: Yes Stairs assistance: Supervision Stair Management: Step to pattern;Forwards;Two rails Number of Stairs: 3 General stair comments: up forwards , down backwards, NWB  Wheelchair Mobility    Modified Rankin (Stroke Patients Only)       Balance                                             Pertinent Vitals/Pain No pain.    Home Living Family/patient expects to be discharged to:: Private residence Living Arrangements: Spouse/significant other Available Help at Discharge: Family Type of Home: House Home Access: Stairs to enter Entrance Stairs-Rails: Can reach both Entrance Stairs-Number of Steps: 3 Home Layout: One level Home Equipment: None      Prior Function Level of Independence: Independent               Hand Dominance        Extremity/Trunk Assessment   Upper Extremity Assessment: Overall WFL for tasks assessed           Lower Extremity Assessment: Overall WFL for tasks assessed      Cervical / Trunk Assessment: Normal  Communication   Communication: No difficulties  Cognition Arousal/Alertness: Awake/alert   Overall Cognitive Status: Within Functional Limits for tasks assessed                      General Comments      Exercises        Assessment/Plan    PT Assessment Patent does not need any further PT services  PT Diagnosis     PT Problem List    PT Treatment Interventions     PT Goals (Current goals can be found in the Care Plan section) Acute Rehab PT Goals PT Goal Formulation: No goals set, d/c therapy    Frequency     Barriers to discharge        Co-evaluation               End of Session   Activity Tolerance: Patient tolerated treatment well Patient left: in bed;with call bell/phone within reach;with nursing/sitter in room Nurse Communication: Mobility status         Time: [id number]  PT Time Calculation (min): 22 min   Charges:   PT Evaluation $Initial PT Evaluation Tier I: 1 Procedure PT Treatments $Gait Training: 8-22 mins   PT G Codes:          Rada Hay 02/13/2014, 10:38 AM

## 2014-02-13 NOTE — Discharge Instructions (Signed)
Abscess An abscess is an infected area that contains a collection of pus and debris.It can occur in almost any part of the body. An abscess is also known as a furuncle or boil. CAUSES  An abscess occurs when tissue gets infected. This can occur from blockage of oil or sweat glands, infection of hair follicles, or a minor injury to the skin. As the body tries to fight the infection, pus collects in the area and creates pressure under the skin. This pressure causes pain. People with weakened immune systems have difficulty fighting infections and get certain abscesses more often.  SYMPTOMS Usually an abscess develops on the skin and becomes a painful mass that is red, warm, and tender. If the abscess forms under the skin, you may feel a moveable soft area under the skin. Some abscesses break open (rupture) on their own, but most will continue to get worse without care. The infection can spread deeper into the body and eventually into the bloodstream, causing you to feel ill.  DIAGNOSIS  Your caregiver will take your medical history and perform a physical exam. A sample of fluid may also be taken from the abscess to determine what is causing your infection. TREATMENT  Your caregiver may prescribe antibiotic medicines to fight the infection. However, taking antibiotics alone usually does not cure an abscess. Your caregiver may need to make a small cut (incision) in the abscess to drain the pus. In some cases, gauze is packed into the abscess to reduce pain and to continue draining the area. HOME CARE INSTRUCTIONS   Only take over-the-counter or prescription medicines for pain, discomfort, or fever as directed by your caregiver.  If you were prescribed antibiotics, take them as directed. Finish them even if you start to feel better.  If gauze is used, follow your caregiver's directions for changing the gauze.  To avoid spreading the infection:  Keep your draining abscess covered with a  bandage.  Wash your hands well.  Do not share personal care items, towels, or whirlpools with others.  Avoid skin contact with others.  Keep your skin and clothes clean around the abscess.  Keep all follow-up appointments as directed by your caregiver. SEEK MEDICAL CARE IF:   You have increased pain, swelling, redness, fluid drainage, or bleeding.  You have muscle aches, chills, or a general ill feeling.  You have a fever. MAKE SURE YOU: Understand these instructions.  Cellulitis Cellulitis is an infection of the skin and the tissue beneath it. The infected area is usually red and tender. Cellulitis occurs most often in the arms and lower legs.  CAUSES  Cellulitis is caused by bacteria that enter the skin through cracks or cuts in the skin. The most common types of bacteria that cause cellulitis are Staphylococcus and Streptococcus. SYMPTOMS   Redness and warmth.  Swelling.  Tenderness or pain.  Fever. DIAGNOSIS  Your caregiver can usually determine what is wrong based on a physical exam. Blood tests may also be done. TREATMENT  Treatment usually involves taking an antibiotic medicine. HOME CARE INSTRUCTIONS   Take your antibiotics as directed. Finish them even if you start to feel better.  Keep the infected arm or leg elevated to reduce swelling.  Apply a warm cloth to the affected area up to 4 times per day to relieve pain.  Only take over-the-counter or prescription medicines for pain, discomfort, or fever as directed by your caregiver.  Keep all follow-up appointments as directed by your caregiver. SEEK MEDICAL CARE  IF:   You notice red streaks coming from the infected area.  Your red area gets larger or turns dark in color.  Your bone or joint underneath the infected area becomes painful after the skin has healed.  Your infection returns in the same area or another area.  You notice a swollen bump in the infected area.  You develop new symptoms. SEEK  IMMEDIATE MEDICAL CARE IF:   You have a fever.  You feel very sleepy.  You develop vomiting or diarrhea.  You have a general ill feeling (malaise) with muscle aches and pains. MAKE SURE YOU:   Understand these instructions.  Will watch your condition.  Will get help right away if you are not doing well or get worse. Document Released: 08/03/2005 Document Revised: 04/24/2012 Document Reviewed: 01/09/2012 [facility] Patient Information 2014 [place], [place].    Will watch your condition.  Will get help right away if you are not doing well or get worse. Document Released: 08/03/2005 Document Revised: 04/24/2012 Document Reviewed: 01/06/2012 [facility] Patient Information 2014 [place], [place].

## 2014-02-13 NOTE — Progress Notes (Signed)
[facility]   [facility] the [facility]: RW  If patient discharges after hours, please call [phone].   Renard HamperLecretia Williamson 02/13/2014, 10:58 AM

## 2014-02-13 NOTE — Progress Notes (Signed)
Peripherally Inserted Central Catheter/Midline Placement  The IV Nurse has discussed with the patient and/or persons authorized to consent for the patient, the purpose of this procedure and the potential benefits and risks involved with this procedure.  The benefits include less needle sticks, lab draws from the catheter and patient may be discharged home with the catheter.  Risks include, but not limited to, infection, bleeding, blood clot (thrombus formation), and puncture of an artery; nerve damage and irregular heat beat.  Alternatives to this procedure were also discussed.  PICC/Midline Placement Documentation        Jason DevoidLucynda Jeanette Cherilynn Duran 02/13/2014, 8:48 AM Consent was on chart from prior PICC insertion.  I reviewed everything with him and he was okay to proceed.

## 2014-02-13 NOTE — Progress Notes (Signed)
Wound vac has been approved by [employer] and will be delivered between 11:30 and 12 noon. [facility].  Algernon Huxleyuth Himmat Enberg RN BSN   [phone]

## 2014-02-13 NOTE — Progress Notes (Signed)
   Subjective:  Patient reports pain as mild.  No events  Objective:   VITALS:   Filed Vitals:   02/12/14 0500 02/12/14 2156 02/13/14 0629 02/13/14 0900  BP: 148/87 136/85 128/72 132/84  Pulse: 65 77 72 85  Temp: 97.9 F (36.6 C) 99.5 F (37.5 C) 98.9 F (37.2 C) 98 F (36.7 C)  TempSrc: Oral Oral Oral Oral  Resp: 18 20 16 16   Height:      Weight:      SpO2: 98% 98% 96% 98%    Neurologically intact Neurovascular intact Sensation intact distally Intact pulses distally Dorsiflexion/Plantar flexion intact Incision: dressing C/D/I and no drainage No cellulitis present Compartment soft VAC with good seal and suction   Lab Results  Component Value Date   WBC 5.4 02/12/2014   HGB 14.7 02/12/2014   HCT 42.2 02/12/2014   MCV 87.4 02/12/2014   PLT 209 02/12/2014     Assessment/Plan:  2 Days Post-Op   - Up with PT/OT - DVT ppx - SCDs, ambulation - NWB left and lower extremity - Pain control - well controlled - needs home vac if possible and then may come back to office for removal - abx per hospitalist, recommend 4 weeks of treatment total - dressing change ordered for donor site  Problem List Items Addressed This Visit     Musculoskeletal and Integument   Onychomycosis   Relevant Medications      vancomycin (VANCOCIN) IVPB 1000 mg/200 mL premix (Completed)      terbinafine (LAMISIL) 1 % cream      ceFAZolin (ANCEF) IVPB 2 g/50 mL premix (Completed)     Other   Soft tissue infection of foot - Primary   Relevant Medications      vancomycin (VANCOCIN) IVPB 1000 mg/200 mL premix (Completed)      terbinafine (LAMISIL) 1 % cream      ceFAZolin (ANCEF) IVPB 2 g/50 mL premix (Completed)   *Sepsis   Relevant Medications      vancomycin (VANCOCIN) IVPB 1000 mg/200 mL premix (Completed)      terbinafine (LAMISIL) 1 % cream      ceFAZolin (ANCEF) IVPB 2 g/50 mL premix (Completed)   Leukocytosis   Hyperkalemia   Cellulitis of left foot       Cheral Almasaiping Michael  Elyn Krogh 02/13/2014, 9:33 AM [phone]

## 2014-02-18 ENCOUNTER — Emergency Department (HOSPITAL_COMMUNITY)
Admission: EM | Admit: 2014-02-18 | Discharge: 2014-02-19 | Disposition: A | Discharge status: Home / Self Care | Payer: Self-pay | Attending: Emergency Medicine | Admitting: Emergency Medicine

## 2014-02-18 ENCOUNTER — Encounter (HOSPITAL_COMMUNITY): Payer: Self-pay | Admitting: Emergency Medicine

## 2014-02-18 DIAGNOSIS — F17200 Nicotine dependence, unspecified, uncomplicated: Secondary | ICD-10-CM | POA: Insufficient documentation

## 2014-02-18 DIAGNOSIS — Z79899 Other long term (current) drug therapy: Secondary | ICD-10-CM | POA: Insufficient documentation

## 2014-02-18 DIAGNOSIS — R112 Nausea with vomiting, unspecified: Secondary | ICD-10-CM | POA: Insufficient documentation

## 2014-02-18 DIAGNOSIS — R1084 Generalized abdominal pain: Secondary | ICD-10-CM | POA: Insufficient documentation

## 2014-02-18 DIAGNOSIS — Z792 Long term (current) use of antibiotics: Secondary | ICD-10-CM | POA: Insufficient documentation

## 2014-02-18 DIAGNOSIS — R197 Diarrhea, unspecified: Secondary | ICD-10-CM | POA: Insufficient documentation

## 2014-02-18 LAB — CBC WITH DIFFERENTIAL/PLATELET
BASOS PCT: 0 % (ref 0–1)
Basophils Absolute: 0 10*3/uL (ref 0.0–0.1)
Eosinophils Absolute: 0.2 10*3/uL (ref 0.0–0.7)
Eosinophils Relative: 2 % (ref 0–5)
HCT: 43 % (ref 39.0–52.0)
HEMOGLOBIN: 15 g/dL (ref 13.0–17.0)
LYMPHS ABS: 1.4 10*3/uL (ref 0.7–4.0)
LYMPHS PCT: 13 % (ref 12–46)
MCH: 30.2 pg (ref 26.0–34.0)
MCHC: 34.9 g/dL (ref 30.0–36.0)
MCV: 86.7 fL (ref 78.0–100.0)
MONOS PCT: 7 % (ref 3–12)
Monocytes Absolute: 0.7 10*3/uL (ref 0.1–1.0)
NEUTROS ABS: 8.1 10*3/uL — AB (ref 1.7–7.7)
Neutrophils Relative %: 78 % — ABNORMAL HIGH (ref 43–77)
Platelets: 214 10*3/uL (ref 150–400)
RBC: 4.96 MIL/uL (ref 4.22–5.81)
RDW: 12.6 % (ref 11.5–15.5)
WBC: 10.4 10*3/uL (ref 4.0–10.5)

## 2014-02-18 LAB — COMPREHENSIVE METABOLIC PANEL
ALBUMIN: 3.7 g/dL (ref 3.5–5.2)
ALK PHOS: 83 U/L (ref 39–117)
ALT: 29 U/L (ref 0–53)
AST: 16 U/L (ref 0–37)
BILIRUBIN TOTAL: 0.3 mg/dL (ref 0.3–1.2)
BUN: 17 mg/dL (ref 6–23)
CO2: 23 meq/L (ref 19–32)
Calcium: 9.1 mg/dL (ref 8.4–10.5)
Chloride: 100 mEq/L (ref 96–112)
Creatinine, Ser: 0.91 mg/dL (ref 0.50–1.35)
GFR calc Af Amer: >90 mL/min (ref >90)
GFR calc non Af Amer: >90 mL/min (ref >90)
Glucose, Bld: 128 mg/dL — ABNORMAL HIGH (ref 70–99)
POTASSIUM: 3.4 meq/L — AB (ref 3.7–5.3)
Sodium: 139 mEq/L (ref 137–147)
Total Protein: 8 g/dL (ref 6.0–8.3)

## 2014-02-18 MED ORDER — ONDANSETRON HCL 4 MG/2ML IJ SOLN
INTRAMUSCULAR | Status: AC
  Filled 2014-02-18: qty 2

## 2014-02-18 MED ORDER — ONDANSETRON HCL 4 MG/2ML IJ SOLN
4.0000 mg | Freq: Once | INTRAMUSCULAR | Status: AC
  Administered 2014-02-18: 4 mg via INTRAVENOUS

## 2014-02-18 MED ORDER — SODIUM CHLORIDE 0.9 % IV BOLUS (SEPSIS)
2000.0000 mL | Freq: Once | INTRAVENOUS | Status: AC
  Administered 2014-02-18: 2000 mL via INTRAVENOUS

## 2014-02-18 NOTE — ED Notes (Signed)
Patient very diaphoretic Patient refusing rectal temp to verify core body temperature

## 2014-02-18 NOTE — ED Notes (Signed)
Patient arrives via EMS for c/o N/V and abdomin pain  Patient given Zofran IV 4 mg by EMS en route to ED [facility] Patient has PICC line present on arrival--hep locked

## 2014-02-18 NOTE — ED Notes (Signed)
Bed: [id number] Expected date:  Expected time:  Means of arrival:  Comments: EMS 27yo Abd pain, N/V/D

## 2014-02-19 ENCOUNTER — Encounter (HOSPITAL_COMMUNITY): Payer: Self-pay | Admitting: Emergency Medicine

## 2014-02-19 ENCOUNTER — Emergency Department (HOSPITAL_COMMUNITY)
Admission: EM | Admit: 2014-02-19 | Discharge: 2014-02-19 | Disposition: A | Discharge status: Home / Self Care | Payer: Self-pay | Attending: Emergency Medicine | Admitting: Emergency Medicine

## 2014-02-19 ENCOUNTER — Emergency Department (HOSPITAL_COMMUNITY): Payer: Self-pay

## 2014-02-19 DIAGNOSIS — Z95828 Presence of other vascular implants and grafts: Secondary | ICD-10-CM

## 2014-02-19 DIAGNOSIS — R110 Nausea: Secondary | ICD-10-CM | POA: Insufficient documentation

## 2014-02-19 DIAGNOSIS — Z79899 Other long term (current) drug therapy: Secondary | ICD-10-CM | POA: Insufficient documentation

## 2014-02-19 DIAGNOSIS — R109 Unspecified abdominal pain: Secondary | ICD-10-CM | POA: Insufficient documentation

## 2014-02-19 DIAGNOSIS — F17200 Nicotine dependence, unspecified, uncomplicated: Secondary | ICD-10-CM | POA: Insufficient documentation

## 2014-02-19 DIAGNOSIS — Z452 Encounter for adjustment and management of vascular access device: Secondary | ICD-10-CM | POA: Insufficient documentation

## 2014-02-19 LAB — URINALYSIS, ROUTINE W REFLEX MICROSCOPIC
Bilirubin Urine: NEGATIVE
Glucose, UA: NEGATIVE mg/dL
Ketones, ur: NEGATIVE mg/dL
Leukocytes, UA: NEGATIVE
Nitrite: NEGATIVE
Protein, ur: NEGATIVE mg/dL
Specific Gravity, Urine: 1.016 (ref 1.005–1.030)
Urobilinogen, UA: 1 mg/dL (ref 0.0–1.0)
pH: 7 (ref 5.0–8.0)

## 2014-02-19 LAB — URINE MICROSCOPIC-ADD ON

## 2014-02-19 MED ORDER — PROMETHAZINE HCL 25 MG RE SUPP: 25.0000 mg | Freq: Four times a day (QID) | RECTAL | Status: DC | PRN

## 2014-02-19 MED ORDER — ONDANSETRON 8 MG PO TBDP
8.0000 mg | ORAL_TABLET | Freq: Once | ORAL | Status: AC
  Administered 2014-02-19: 8 mg via ORAL
  Filled 2014-02-19: qty 1

## 2014-02-19 MED ORDER — HEPARIN SOD (PORK) LOCK FLUSH 100 UNIT/ML IV SOLN
INTRAVENOUS | Status: AC
  Filled 2014-02-19: qty 5

## 2014-02-19 MED ORDER — PROMETHAZINE HCL 25 MG/ML IJ SOLN
25.0000 mg | Freq: Once | INTRAMUSCULAR | Status: DC
  Filled 2014-02-19: qty 1

## 2014-02-19 MED ORDER — PROMETHAZINE HCL 25 MG PO TABS: 25.0000 mg | ORAL_TABLET | Freq: Four times a day (QID) | ORAL | Status: DC | PRN

## 2014-02-19 MED ORDER — METOCLOPRAMIDE HCL 5 MG/ML IJ SOLN
10.0000 mg | Freq: Once | INTRAMUSCULAR | Status: AC
  Administered 2014-02-19: 10 mg via INTRAMUSCULAR
  Filled 2014-02-19: qty 2

## 2014-02-19 MED ORDER — METOCLOPRAMIDE HCL 5 MG/ML IJ SOLN: 10.0000 mg | Freq: Once | INTRAMUSCULAR | Status: DC

## 2014-02-19 MED ORDER — LORAZEPAM 2 MG/ML IJ SOLN
1.0000 mg | Freq: Once | INTRAMUSCULAR | Status: AC
  Administered 2014-02-19: 1 mg via INTRAMUSCULAR
  Filled 2014-02-19: qty 1

## 2014-02-19 MED ORDER — PROMETHAZINE HCL 25 MG/ML IJ SOLN
25.0000 mg | Freq: Once | INTRAMUSCULAR | Status: AC
Start: 2014-02-19 — End: 2014-02-19
  Administered 2014-02-19: 25 mg via INTRAMUSCULAR
  Filled 2014-02-19: qty 1

## 2014-02-19 MED ORDER — OXYCODONE-ACETAMINOPHEN 5-325 MG PO TABS
2.0000 | ORAL_TABLET | Freq: Once | ORAL | Status: AC
  Administered 2014-02-19: 2 via ORAL
  Filled 2014-02-19: qty 2

## 2014-02-19 NOTE — ED Notes (Signed)
Pt is requesting pain medication prescription, PA notified

## 2014-02-19 NOTE — ED Notes (Signed)
Patient medicated with IM Reglan, see MAR No active N/V or emesis while in room Patient refusing VS Will inform PA

## 2014-02-19 NOTE — ED Provider Notes (Signed)
Pt received from [place], PA-C.  Pt presented to ED w/ N/V.  He has received ODT zofran, IM phenergan and IM ativan.  He continues to c/o vomiting, though it appears that he is retching and spitting.  He states that he is not ready to be discharged yet because he is afraid he will continue to vomit at home and "I do not know what he is feeling".   IM reglan ordered.  If he has one more questionable episode of vomiting after receiving this medication, will admit to medicine for intractable N/V. 5:07 AM   No further vomiting.  Pt refused vital signs.  D/c'd home.  Following discharge, patient yanked out and broke his PICC line, a piece retained in vasculature, and will be re-registered.  I have ordered CXR.  Merrell, PA-C to see patient.  6:36 AM    Otilio Miuatherine E Saulo Anthis, PA-C 02/19/14 [phone]

## 2014-02-19 NOTE — ED Notes (Signed)
IV RN was contacted multiple times regarding PICC placement; since IR stated it may be few hours before they can get to this pt.They were going to send someone to ED to suture the PICC line after the IV RN inserts it, however the IV RN states that since [facility] has pt on their waiting list she had to take him off of hers due to policy.  Consulting civil engineerCharge RN as well as PA are aware of delay.

## 2014-02-19 NOTE — Progress Notes (Signed)
Patient requested gauze bandage to be placed on top of left foot.  Gauze and tegaderm bandage applied.

## 2014-02-19 NOTE — ED Provider Notes (Signed)
CSN: [id number]     Arrival date & time 02/18/14  2137 History   First MD Initiated Contact with Patient 02/18/14 2211     Chief Complaint  Patient presents with  . Nausea  . Emesis  . Abdominal Pain     (Consider location/radiation/quality/duration/timing/severity/associated sxs/prior Treatment) HPI Patient presents to the emergency department with nausea, vomiting, diarrhea, that started earlier this evening.  The patient, states, that right after he ate dinner he started with his symptoms.  Patient denies chest pain, shortness of breath, headache, blurred vision, weakness, dizziness, numbness, back pain, dysuria fever, or rash.  The patient, states, that he has not had any syncope.  The patient denies taking any medications prior to arrival.      History reviewed. No pertinent past medical history. Past Surgical History  Procedure Laterality Date  . I&d extremity Left 02/04/2014    Procedure: IRRIGATION AND DEBRIDEMENT LEFT FOOT ABSCESS;  Surgeon: Cheral AlmasNaiping Michael Xu, MD;  Location: [facility];  Service: Orthopedics;  Laterality: Left;  . I&d extremity Left 02/07/2014    Procedure: IRRIGATION AND DEBRIDEMENT LEFT FOOT, WOUND VAC;  Surgeon: Cheral AlmasNaiping Michael Xu, MD;  Location: [facility];  Service: Orthopedics;  Laterality: Left;  . Application of wound vac Left 02/07/2014    Procedure: APPLICATION OF WOUND VAC;  Surgeon: Cheral AlmasNaiping Michael Xu, MD;  Location: [facility];  Service: Orthopedics;  Laterality: Left;  . I&d extremity Left 02/11/2014    Procedure: IRRIGATION AND DEBRIDEMENT LEFT FOOT, SKIN GRAFT TAKEN FROM LEFT UPPER THIGH;  Surgeon: Cheral AlmasNaiping Michael Xu, MD;  Location: [facility];  Service: Orthopedics;  Laterality: Left;   History reviewed. No pertinent family history. History  Substance Use Topics  . Smoking status: Current Every Day Smoker -- 0.25 packs/day for 8 years    Types: Cigarettes  . Smokeless tobacco: Never Used  . Alcohol Use: No    Review of Systems All other systems negative  except as documented in the HPI. All pertinent positives and negatives as reviewed in the HPI.  Allergies  Review of patient's allergies indicates no known allergies.  Home Medications   Prior to Admission medications   Medication Sig Start Date End Date Taking? Authorizing Provider  acetaminophen (TYLENOL) 500 MG tablet Take 1,000 mg by mouth every 6 (six) hours as needed for headache.   Yes Historical Provider, MD  ibuprofen (ADVIL,MOTRIN) 600 MG tablet Take 600 mg by mouth every 6 (six) hours as needed for moderate pain.   Yes Historical Provider, MD  methocarbamol (ROBAXIN) 500 MG tablet Take 1 tablet (500 mg total) by mouth every 6 (six) hours as needed for muscle spasms. 02/13/14  Yes Osvaldo ShipperGokul Krishnan, MD  oxyCODONE (OXY IR/ROXICODONE) 5 MG immediate release tablet Take 1-2 tablets (5-10 mg total) by mouth every 4 (four) hours as needed for severe pain. 02/13/14  Yes Osvaldo ShipperGokul Krishnan, MD  polyethylene glycol (MIRALAX / GLYCOLAX) packet Take 17 g by mouth daily. 02/13/14  Yes Osvaldo ShipperGokul Krishnan, MD  senna-docusate (SENOKOT-S) 8.6-50 MG per tablet Take 2 tablets by mouth 2 (two) times daily. 02/13/14  Yes Osvaldo ShipperGokul Krishnan, MD  terbinafine (LAMISIL) 1 % cream Apply topically 2 (two) times daily. 02/13/14  Yes Osvaldo ShipperGokul Krishnan, MD  tetrahydrozoline 0.05 % ophthalmic solution Place 1 drop into both eyes as needed (dry eyes).   Yes Historical Provider, MD  vancomycin 1,250 mg in sodium chloride 0.9 % 250 mL Inject 1,250 mg into the vein every 8 (eight) hours. For 2 weeks 02/13/14  Yes Gokul  Rito EhrlichKrishnan, MD   BP 146/75  Pulse 71  Temp(Src) 99.1 F (37.3 C) (Oral)  Resp 18  SpO2 99% Physical Exam  Nursing note and vitals reviewed. Constitutional: He is oriented to person, place, and time. He appears well-developed and well-nourished. No distress.  HENT:  Head: Normocephalic and atraumatic.  Mouth/Throat: Oropharynx is clear and moist.  Eyes: Pupils are equal, round, and reactive to light.  Neck: Normal range of  motion. Neck supple.  Cardiovascular: Normal rate, regular rhythm and normal heart sounds.  Exam reveals no gallop and no friction rub.   No murmur heard. Pulmonary/Chest: Effort normal and breath sounds normal. No respiratory distress.  Abdominal: Soft. Bowel sounds are normal. He exhibits no distension. There is generalized tenderness. There is no rebound and no guarding.  Neurological: He is alert and oriented to person, place, and time.  Skin: Skin is warm and dry.    ED Course  Procedures (including critical care time) Labs Review Labs Reviewed  CBC WITH DIFFERENTIAL - Abnormal; Notable for the following:    Neutrophils Relative % 78 (*)    Neutro Abs 8.1 (*)    All other components within normal limits  COMPREHENSIVE METABOLIC PANEL - Abnormal; Notable for the following:    Potassium 3.4 (*)    Glucose, Bld 128 (*)    All other components within normal limits  URINALYSIS, ROUTINE W REFLEX MICROSCOPIC - Abnormal; Notable for the following:    Hgb urine dipstick TRACE (*)    All other components within normal limits  URINE MICROSCOPIC-ADD ON - Abnormal; Notable for the following:    Crystals CA OXALATE CRYSTALS (*)    All other components within normal limits    Imaging Review Ct Abdomen Pelvis Wo Contrast  02/19/2014   CLINICAL DATA:  Microhematuria, abdominal pain  EXAM: CT ABDOMEN AND PELVIS WITHOUT CONTRAST  TECHNIQUE: Multidetector CT imaging of the abdomen and pelvis was performed following the standard protocol without intravenous contrast.  COMPARISON:  None.  FINDINGS: The lung bases are clear.  No renal, ureteral, or bladder calculi. No obstructive uropathy. No perinephric stranding is seen. The kidneys are symmetric in size without evidence for exophytic mass. The bladder is unremarkable.  The liver demonstrates no focal abnormality. The gallbladder is unremarkable. The spleen demonstrates no focal abnormality. The adrenal glands and pancreas are normal.  The unopacified  stomach, duodenum, small intestine and large intestine are unremarkable, but evaluation is limited by lack of oral contrast. There is a small umbilical fat containing hernia. There is no pneumoperitoneum, pneumatosis, or portal venous gas. There is no abdominal or pelvic free fluid. There is no lymphadenopathy.  The abdominal aorta is normal in caliber .  The osseous structures are unremarkable.  IMPRESSION: No urolithiasis or obstructive uropathy.   Electronically Signed   By: Elige KoHetal  Patel   On: 02/19/2014 00:56    Patient is very animated and not very cooperative.  On exam, and history of present illness.  The patient pulled out his IV and the nurses are going to given IM, Phenergan, and will observe him further here in the emergency department.  He did get almost 2 L of fluid.  The patient is not in any acute distress at this time.  On reexamination the patient is feeling some better, but not as far as his nausea is concerned.   Carlyle Dollyhristopher W Alcario Tinkey, PA-C 02/19/14 0125  Carlyle Dollyhristopher W Leina Babe, PA-C 02/19/14 [phone]

## 2014-02-19 NOTE — ED Notes (Signed)
Patient informed of discharge Patient initially would not remove blankets from head or acknowledge nursing staff Patient then began to dry heave--no emesis

## 2014-02-19 NOTE — ED Notes (Signed)
Charge nurse at bedside

## 2014-02-19 NOTE — Discharge Instructions (Signed)
Take phenergan as needed for nausea and vomiting.  If you can not keep this medication down and can afford the suppositories, try using those.  Get rest and drink plenty of fluids.  Return to the ER if you are unable to control your vomiting or develop severe abdominal pain.

## 2014-02-19 NOTE — Progress Notes (Signed)
[facility] CL provided pt with a list of primary care resources to help establish primary care. Patient just grunted when I was in room, made no acknowledgment that CL was even in room.

## 2014-02-19 NOTE — Procedures (Signed)
RUE PICC 43 cm Tip SVC RA No comp

## 2014-02-19 NOTE — ED Notes (Signed)
Patient was DC from the ED and staff found that patient had pulled his PICC line out of his left arm.

## 2014-02-19 NOTE — ED Notes (Signed)
Patient actively vomiting Will make EDP aware

## 2014-02-19 NOTE — ED Notes (Signed)
Patient back from CT Patient has removed himself from monitor

## 2014-02-19 NOTE — ED Notes (Signed)
PA aware that patient removed PIV site Per PA, OK to give IV Phenergan via IM route  Will give Phenergan IM

## 2014-02-19 NOTE — ED Notes (Signed)
Patient medicated, see MAR No active N/V at this time Patient denied further needs at this time Side rails up, call bell in reach

## 2014-02-19 NOTE — ED Notes (Signed)
Called IR to get an update, per Elmarie Shileyiffany they will be ready for Mr Jason Duran around 4pm

## 2014-02-19 NOTE — ED Notes (Signed)
Patient resting in position of comfort with eyes closed RR WNL--even and unlabored with equal rise and fall of chest Patient in NAD Side rails up, call bell in reach  

## 2014-02-19 NOTE — ED Notes (Signed)
Patient refusing discharge VS DC instructions reviewed with patient during which patient did not look at or acknowledge this nurse Patient refusing to sign at DC Patient in NAD upon leaving ED in wheelchair--patient informed EDT that he called for a ride No N/V or emesis upon time of DC

## 2014-02-19 NOTE — ED Provider Notes (Signed)
CSN: [id number]     Arrival date & time 02/19/14  0631 History   First MD Initiated Contact with Patient 02/19/14 [phone]     Chief Complaint  Patient presents with  . Vascular Access Problem     (Consider location/radiation/quality/duration/timing/severity/associated sxs/prior Treatment) HPI Comments: Patient is a 28 year old male with history of left foot abscess and PICC line insertion who presents today after ripping his PICC line out of his arm. He reports that he did not mean to do this and that he was going to get up when "it fell out". He needs this PICC line for a 6 week treatment of vancomycin after left foot abscess. Per the nursing staff he had ripped prior IVs out during his last ED stay. He presented to the ED last night for n/v/d which was felt to be a viral gastroenteritis. The patient seemed improved. He was retching without actual emesis and was discharged. The patient adamantly denies IVDA. He occasionally smokes marijuana.   The history is provided by the patient. No language interpreter was used.    History reviewed. No pertinent past medical history. Past Surgical History  Procedure Laterality Date  . I&d extremity Left 02/04/2014    Procedure: IRRIGATION AND DEBRIDEMENT LEFT FOOT ABSCESS;  Surgeon: Cheral AlmasNaiping Michael Xu, MD;  Location: [facility];  Service: Orthopedics;  Laterality: Left;  . I&d extremity Left 02/07/2014    Procedure: IRRIGATION AND DEBRIDEMENT LEFT FOOT, WOUND VAC;  Surgeon: Cheral AlmasNaiping Michael Xu, MD;  Location: [facility];  Service: Orthopedics;  Laterality: Left;  . Application of wound vac Left 02/07/2014    Procedure: APPLICATION OF WOUND VAC;  Surgeon: Cheral AlmasNaiping Michael Xu, MD;  Location: [facility];  Service: Orthopedics;  Laterality: Left;  . I&d extremity Left 02/11/2014    Procedure: IRRIGATION AND DEBRIDEMENT LEFT FOOT, SKIN GRAFT TAKEN FROM LEFT UPPER THIGH;  Surgeon: Cheral AlmasNaiping Michael Xu, MD;  Location: [facility];  Service: Orthopedics;  Laterality: Left;   History  reviewed. No pertinent family history. History  Substance Use Topics  . Smoking status: Current Every Day Smoker -- 0.25 packs/day for 8 years    Types: Cigarettes  . Smokeless tobacco: Never Used  . Alcohol Use: No    Review of Systems  Constitutional: Negative for fever and chills.  Cardiovascular: Negative for chest pain.  Gastrointestinal: Positive for nausea and abdominal pain. Negative for vomiting and diarrhea.  All other systems reviewed and are negative.     Allergies  Review of patient's allergies indicates no known allergies.  Home Medications   Prior to Admission medications   Medication Sig Start Date End Date Taking? Authorizing Provider  acetaminophen (TYLENOL) 500 MG tablet Take 1,000 mg by mouth every 6 (six) hours as needed for headache.    Historical Provider, MD  ibuprofen (ADVIL,MOTRIN) 600 MG tablet Take 600 mg by mouth every 6 (six) hours as needed for moderate pain.    Historical Provider, MD  methocarbamol (ROBAXIN) 500 MG tablet Take 1 tablet (500 mg total) by mouth every 6 (six) hours as needed for muscle spasms. 02/13/14   Osvaldo ShipperGokul Krishnan, MD  oxyCODONE (OXY IR/ROXICODONE) 5 MG immediate release tablet Take 1-2 tablets (5-10 mg total) by mouth every 4 (four) hours as needed for severe pain. 02/13/14   Osvaldo ShipperGokul Krishnan, MD  promethazine (PHENERGAN) 25 MG suppository Place 1 suppository (25 mg total) rectally every 6 (six) hours as needed for nausea or vomiting. 02/19/14   Arie Sabinaatherine E Schinlever, PA-C  promethazine (PHENERGAN) 25  MG tablet Take 1 tablet (25 mg total) by mouth every 6 (six) hours as needed for nausea or vomiting. 02/19/14   Arie Sabina Schinlever, PA-C  senna-docusate (SENOKOT-S) 8.6-50 MG per tablet Take 2 tablets by mouth 2 (two) times daily. 02/13/14   Osvaldo Shipper, MD  terbinafine (LAMISIL) 1 % cream Apply topically 2 (two) times daily. 02/13/14   Osvaldo Shipper, MD  tetrahydrozoline 0.05 % ophthalmic solution Place 1 drop into both eyes as needed  (dry eyes).    Historical Provider, MD  vancomycin 1,250 mg in sodium chloride 0.9 % 250 mL Inject 1,250 mg into the vein every 8 (eight) hours. For 2 weeks 02/13/14   Osvaldo Shipper, MD   BP 150/69  Pulse 57  Temp(Src) 99.2 F (37.3 C) (Oral)  Resp 18  SpO2 97% Physical Exam  Nursing note and vitals reviewed. Constitutional: He is oriented to person, place, and time. He appears well-developed and well-nourished. No distress.  HENT:  Head: Normocephalic and atraumatic.  Right Ear: External ear normal.  Left Ear: External ear normal.  Nose: Nose normal.  Eyes: Conjunctivae are normal.  Neck: Normal range of motion. No tracheal deviation present.  Cardiovascular: Normal rate, regular rhythm and normal heart sounds.   Pulmonary/Chest: Effort normal and breath sounds normal. No stridor.  Abdominal: Soft. Bowel sounds are normal. He exhibits no distension. There is no tenderness. There is no rigidity and no guarding.  Musculoskeletal: Normal range of motion.  Dorsal aspect of left foot with well attached skin graft. No surrounding cellulitis at this time. No warmth or purulent drainage.  Neurological: He is alert and oriented to person, place, and time.  Skin: Skin is warm and dry. He is not diaphoretic.  Psychiatric: He has a normal mood and affect. His behavior is normal.    ED Course  Procedures (including critical care time) Labs Review Labs Reviewed - No data to display  Imaging Review Ct Abdomen Pelvis Wo Contrast  02/19/2014   CLINICAL DATA:  Microhematuria, abdominal pain  EXAM: CT ABDOMEN AND PELVIS WITHOUT CONTRAST  TECHNIQUE: Multidetector CT imaging of the abdomen and pelvis was performed following the standard protocol without intravenous contrast.  COMPARISON:  None.  FINDINGS: The lung bases are clear.  No renal, ureteral, or bladder calculi. No obstructive uropathy. No perinephric stranding is seen. The kidneys are symmetric in size without evidence for exophytic mass.  The bladder is unremarkable.  The liver demonstrates no focal abnormality. The gallbladder is unremarkable. The spleen demonstrates no focal abnormality. The adrenal glands and pancreas are normal.  The unopacified stomach, duodenum, small intestine and large intestine are unremarkable, but evaluation is limited by lack of oral contrast. There is a small umbilical fat containing hernia. There is no pneumoperitoneum, pneumatosis, or portal venous gas. There is no abdominal or pelvic free fluid. There is no lymphadenopathy.  The abdominal aorta is normal in caliber .  The osseous structures are unremarkable.  IMPRESSION: No urolithiasis or obstructive uropathy.   Electronically Signed   By: Elige Ko   On: 02/19/2014 00:56   Dg Chest 2 View  02/19/2014   CLINICAL DATA:  PICC line came out and concern for a retained piece.  EXAM: CHEST  2 VIEW  COMPARISON:  None.  FINDINGS: No evidence for a radiopaque foreign body. Heart and mediastinum are within normal limits. Both lungs are clear. Negative for pneumothorax. The trachea is midline. No acute bone abnormality.  IMPRESSION: No active cardiopulmonary disease.  No evidence  for a radiopaque foreign body.   Electronically Signed   By: Richarda OverlieAdam  Henn M.D.   On: 02/19/2014 07:02   Ir Fluoro Guide Cv Line Right  02/19/2014   CLINICAL DATA:  Cellulitis  EXAM: Right upper extremity PICC LINE PLACEMENT WITH ULTRASOUND AND FLUOROSCOPIC GUIDANCE  FLUOROSCOPY TIME:  18 seconds.  PROCEDURE: The patient was advised of the possible risks andcomplications and agreed to undergo the procedure. The patient was then brought to the angiographic suite for the procedure.  The right arm was prepped with chlorhexidine, drapedin the usual sterile fashion using maximum barrier technique (cap and mask, sterile gown, sterile gloves, large sterile sheet, hand hygiene and cutaneous antisepsis) and infiltrated locally with 1% Lidocaine.  Ultrasound demonstrated patency of the right basilic vein,  and this was documented with an image. Under real-time ultrasound guidance, this vein was accessed with a 21 gauge micropuncture needle and image documentation was performed. A 0.018 wire was introduced in to the vein. Over this, a 5 [place] single lumen power PICC was advanced to the lower SVC/right atrial junction. Fluoroscopy during the procedure and fluoro spot radiograph confirms appropriate catheter position. The catheter was flushed and covered with asterile dressing.  Complications: None  IMPRESSION: Successful right arm Power PICC line placement with ultrasound and fluoroscopic guidance. The catheter is ready for use.   Electronically Signed   By: Maryclare BeanArt  Hoss M.D.   On: 02/19/2014 17:16   Ir Koreas Guide Vasc Access Right  02/19/2014   CLINICAL DATA:  Cellulitis  EXAM: Right upper extremity PICC LINE PLACEMENT WITH ULTRASOUND AND FLUOROSCOPIC GUIDANCE  FLUOROSCOPY TIME:  18 seconds.  PROCEDURE: The patient was advised of the possible risks andcomplications and agreed to undergo the procedure. The patient was then brought to the angiographic suite for the procedure.  The right arm was prepped with chlorhexidine, drapedin the usual sterile fashion using maximum barrier technique (cap and mask, sterile gown, sterile gloves, large sterile sheet, hand hygiene and cutaneous antisepsis) and infiltrated locally with 1% Lidocaine.  Ultrasound demonstrated patency of the right basilic vein, and this was documented with an image. Under real-time ultrasound guidance, this vein was accessed with a 21 gauge micropuncture needle and image documentation was performed. A 0.018 wire was introduced in to the vein. Over this, a 5 [place] single lumen power PICC was advanced to the lower SVC/right atrial junction. Fluoroscopy during the procedure and fluoro spot radiograph confirms appropriate catheter position. The catheter was flushed and covered with asterile dressing.  Complications: None  IMPRESSION: Successful right arm Power  PICC line placement with ultrasound and fluoroscopic guidance. The catheter is ready for use.   Electronically Signed   By: Maryclare BeanArt  Hoss M.D.   On: 02/19/2014 17:16     EKG Interpretation None      MDM   Final diagnoses:  S/P PICC central line placement    Patient checked into ED because upon being discharged he ripped his PICC line out. PICC line is same length as when placed. No concern for retained fragment. Patient is still waiting for PICC line placement again as he needs IV vancomycin for recent foot abscess. Both IV team and IR are on the way to insert PICC line. Patient is hemodynamically stable. D/c after placement. Dr. Siri ColeZavtiz evaluated this patient and agrees with plan. Patient / Family / Caregiver informed of clinical course, understand medical decision-making process, and agree with plan.     Mora BellmanHannah S Tajai Suder, PA-C 02/20/14 1021

## 2014-02-19 NOTE — ED Notes (Signed)
Patient resting comfortably s/p admin of IM Ativan, see MAR Patient given PO challenge with ginger ale without an episode of N/V or emesis PA at bedside to speak with patient

## 2014-02-19 NOTE — ED Notes (Signed)
Patient pulled out PICC line at time of DC PICC line NOT intact upon inspection by nursing staff ED charge nurse aware

## 2014-02-19 NOTE — ED Notes (Signed)
Patient pulled out PIV site Will attempt to place another site

## 2014-02-20 NOTE — ED Provider Notes (Signed)
Medical screening examination/treatment/procedure(s) were performed by non-physician practitioner and as supervising physician I was immediately available for consultation/collaboration.   EKG Interpretation None        Brylei Pedley, MD 02/20/14 0024 

## 2014-02-20 NOTE — ED Provider Notes (Signed)
Medical screening examination/treatment/procedure(s) were performed by non-physician practitioner and as supervising physician I was immediately available for consultation/collaboration.   EKG Interpretation None        Rolland PorterMark Jeston Junkins, MD 02/20/14 [phone]

## 2014-02-20 NOTE — ED Provider Notes (Signed)
Medical screening examination/treatment/procedure(s) were conducted as a shared visit with non-physician practitioner(s) or resident and myself. I personally evaluated the patient during the encounter and agree with the findings and plan unless otherwise indicated.  I have personally reviewed any xrays and/ or EKG's with the provider and I agree with interpretation.  Patient with recent picc line placed for left foot abscess, on vancomycin for 6 wks presents after PICC line removed. He is unsure how it was removed, reported he pulled it out himself. Denies fevers or chills. Mild decreased appetite.  Recently patient has been causing difficulty with staff, refusing vitals signs and not cooperating. On exam pt well appearing, normal hr, abd soft/ nd, mmm, AO, neck supple, examined left medial arm where picc line was- no erythema/ pain or discharge. Left food bandaged, dorsum wound examined, mild tenderness, mild discharge, pt moves foot/ ankle with mild discomfort, no crepitus.  Plan for PICC line replacement.  Left foot abscess/ infection, picc line   Enid SkeensJoshua M Leni Pankonin, MD 02/20/14 [phone]

## 2014-06-20 ENCOUNTER — Encounter (HOSPITAL_COMMUNITY): Payer: Self-pay | Admitting: Emergency Medicine

## 2014-06-20 ENCOUNTER — Emergency Department (HOSPITAL_COMMUNITY)
Admission: EM | Admit: 2014-06-20 | Discharge: 2014-06-21 | Disposition: A | Discharge status: Home / Self Care | Payer: Self-pay | Attending: Emergency Medicine | Admitting: Emergency Medicine

## 2014-06-20 ENCOUNTER — Emergency Department (HOSPITAL_COMMUNITY): Payer: Self-pay

## 2014-06-20 DIAGNOSIS — Y9389 Activity, other specified: Secondary | ICD-10-CM | POA: Insufficient documentation

## 2014-06-20 DIAGNOSIS — F17200 Nicotine dependence, unspecified, uncomplicated: Secondary | ICD-10-CM | POA: Insufficient documentation

## 2014-06-20 DIAGNOSIS — Y929 Unspecified place or not applicable: Secondary | ICD-10-CM | POA: Insufficient documentation

## 2014-06-20 DIAGNOSIS — S43016A Anterior dislocation of unspecified humerus, initial encounter: Secondary | ICD-10-CM | POA: Insufficient documentation

## 2014-06-20 DIAGNOSIS — S43004A Unspecified dislocation of right shoulder joint, initial encounter: Secondary | ICD-10-CM

## 2014-06-20 DIAGNOSIS — X500XXA Overexertion from strenuous movement or load, initial encounter: Secondary | ICD-10-CM | POA: Insufficient documentation

## 2014-06-20 DIAGNOSIS — S4980XA Other specified injuries of shoulder and upper arm, unspecified arm, initial encounter: Secondary | ICD-10-CM | POA: Insufficient documentation

## 2014-06-20 DIAGNOSIS — S46909A Unspecified injury of unspecified muscle, fascia and tendon at shoulder and upper arm level, unspecified arm, initial encounter: Secondary | ICD-10-CM | POA: Insufficient documentation

## 2014-06-20 MED ORDER — HYDROMORPHONE HCL PF 1 MG/ML IJ SOLN
1.0000 mg | Freq: Once | INTRAMUSCULAR | Status: AC
  Administered 2014-06-20: 1 mg via INTRAVENOUS
  Filled 2014-06-20: qty 1

## 2014-06-20 MED ORDER — PROMETHAZINE HCL 25 MG PO TABS: 25.0000 mg | ORAL_TABLET | Freq: Four times a day (QID) | ORAL | Status: DC | PRN

## 2014-06-20 MED ORDER — PROPOFOL 10 MG/ML IV BOLUS
35.0000 mg | INTRAVENOUS | Status: DC | PRN
  Filled 2014-06-20: qty 1

## 2014-06-20 MED ORDER — OXYCODONE-ACETAMINOPHEN 5-325 MG PO TABS: 1.0000 | ORAL_TABLET | Freq: Four times a day (QID) | ORAL | Status: DC | PRN

## 2014-06-20 MED ORDER — SODIUM CHLORIDE 0.9 % IV SOLN
INTRAVENOUS | Status: DC | PRN
  Administered 2014-06-20: 1000 mL via INTRAVENOUS

## 2014-06-20 MED ORDER — ONDANSETRON HCL 4 MG/2ML IJ SOLN
4.0000 mg | Freq: Once | INTRAMUSCULAR | Status: AC
  Administered 2014-06-20: 4 mg via INTRAVENOUS
  Filled 2014-06-20: qty 2

## 2014-06-20 NOTE — ED Provider Notes (Signed)
Pt presents to the ED after a shoulder dislocation.  Complains of severe pain right shoulder.  No history of medical problems. Physical Exam  BP 151/86  Pulse 92  Temp(Src) 98.5 F (36.9 C) (Oral)  Resp 24  SpO2 99%  Physical Exam  Nursing note and vitals reviewed. Constitutional: He appears well-developed and well-nourished. No distress.  HENT:  Head: Normocephalic and atraumatic.  Right Ear: External ear normal.  Left Ear: External ear normal.  Mouth/Throat: Oropharynx is clear and moist. No oropharyngeal exudate.  Eyes: Conjunctivae are normal. Right eye exhibits no discharge. Left eye exhibits no discharge. No scleral icterus.  Neck: Neck supple. No tracheal deviation present.  Cardiovascular: Normal rate and normal heart sounds.   Pulmonary/Chest: Effort normal. No stridor. No respiratory distress. He has no wheezes.  Musculoskeletal: He exhibits no edema.  Neurological: He is alert. Cranial nerve deficit: no gross deficits.  Skin: Skin is warm and dry. No rash noted.  Psychiatric: He has a normal mood and affect.    ED Course  Procedural sedation Date/Time: 06/20/2014 10:29 PM Performed by: Linwood DibblesKNAPP, Ralph Benavidez Authorized by: Linwood DibblesKNAPP, Xavier Fournier Consent: Verbal consent obtained. written consent obtained. Risks and benefits: risks, benefits and alternatives were discussed Consent given by: patient Patient understanding: patient states understanding of the procedure being performed Patient consent: the patient's understanding of the procedure matches consent given Patient identity confirmed: verbally with patient Time out: Immediately prior to procedure a "time out" was called to verify the correct patient, procedure, equipment, support staff and site/side marked as required. Patient sedated: yes Sedatives: propofol Sedation start date/time: 06/20/2014 10:16 PM Sedation end date/time: 06/20/2014 10:30 PM Vitals: Vital signs were monitored during sedation. Patient tolerance: Patient  tolerated the procedure well with no immediate complications.    MDM Appears to have had a succesful reduction based on exam.  Will xray shoulder to confirm.  Tolerated sedation well.  Somnolent but awake and talking to us at 1029.       Linwood DibblesJon Genecis Veley, MD 06/20/14 2230

## 2014-06-20 NOTE — ED Provider Notes (Signed)
CSN: [id number]     Arrival date & time 06/20/14  2055 History   First MD Initiated Contact with Patient 06/20/14 2103     Chief Complaint  Patient presents with  . Shoulder Injury     (Consider location/radiation/quality/duration/timing/severity/associated sxs/prior Treatment) HPI Comments: Patient is a 28 year old male who presents to the emergency department after feeling his right shoulder pop. He states that he was lifting weights and was lifting weights over his head when this happened. This occurred just prior to arrival. This has never happened to him the past. He describes his pain as severe. Rates as a 10 out of 10. He has not taken any medications prior to arrival. He reports the last time he ate was around noon. He denies any numbness or tingling. He is right hand dominant. 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 No other injuries.   Patient is a 28 y.o. male presenting with shoulder injury. The history is provided by the patient. No language interpreter was used.  Shoulder Injury Associated symptoms include myalgias. Pertinent negatives include no chest pain, chills, fever or vomiting.    History reviewed. No pertinent past medical history. Past Surgical History  Procedure Laterality Date  . I&d extremity Left 02/04/2014    Procedure: IRRIGATION AND DEBRIDEMENT LEFT FOOT ABSCESS;  Surgeon: Cheral Almas, MD;  Location: [facility];  Service: Orthopedics;  Laterality: Left;  . I&d extremity Left 02/07/2014    Procedure: IRRIGATION AND DEBRIDEMENT LEFT FOOT, WOUND VAC;  Surgeon: Cheral Almas, MD;  Location: [facility];  Service: Orthopedics;  Laterality: Left;  . Application of wound vac Left 02/07/2014    Procedure: APPLICATION OF WOUND VAC;  Surgeon: Cheral Almas, MD;  Location: [facility];  Service: Orthopedics;  Laterality: Left;  . I&d extremity Left 02/11/2014    Procedure: IRRIGATION AND DEBRIDEMENT LEFT FOOT, SKIN GRAFT TAKEN FROM LEFT UPPER THIGH;  Surgeon:  Cheral Almas, MD;  Location: [facility];  Service: Orthopedics;  Laterality: Left;  . Foot surgery     History reviewed. No pertinent family history. History  Substance Use Topics  . Smoking status: Current Every Day Smoker -- 0.25 packs/day for 8 years    Types: Cigarettes  . Smokeless tobacco: Never Used  . Alcohol Use: No    Review of Systems  Constitutional: Negative for fever and chills.  Respiratory: Negative for shortness of breath.   Cardiovascular: Negative for chest pain.  Gastrointestinal: Negative for vomiting.  Musculoskeletal: Positive for myalgias.  Neurological: Negative for syncope.  All other systems reviewed and are negative.     Allergies  Review of patient's allergies indicates no known allergies.  Home Medications   Prior to Admission medications   Not on File   BP 151/86  Pulse 92  Temp(Src) 98.5 F (36.9 C) (Oral)  Resp 24  SpO2 99% Physical Exam  Nursing note and vitals reviewed. Constitutional: He is oriented to person, place, and time. He appears well-developed and well-nourished. He appears distressed.  Patient tearful due to pain  HENT:  Head: Normocephalic and atraumatic.  Right Ear: External ear normal.  Left Ear: External ear normal.  Nose: Nose normal.  Eyes: Conjunctivae are normal.  Neck: Normal range of motion. No tracheal deviation present.  Cardiovascular: Normal rate, regular rhythm, normal heart sounds, intact distal pulses and normal pulses.   Pulses:      Radial pulses are 2+ on the right side, and 2+ on the left side.  Capillary refill <  3 seconds in all fingers.   Pulmonary/Chest: Effort normal and breath sounds normal. No stridor.  Abdominal: Soft. He exhibits no distension. There is no tenderness.  Musculoskeletal: Normal range of motion.  Deformity to right shoulder. Sensation intact throughout right arm. Compartment soft. Neurovascularly intact.  Grip strength 5/5 on right.  Neurological: He is alert and  oriented to person, place, and time.  Skin: Skin is warm and dry. He is not diaphoretic.  Psychiatric: He has a normal mood and affect. His behavior is normal.    ED Course  Reduction of dislocation Date/Time: 06/20/2014 1:15 AM Performed by: Mora BellmanMERRELL, Thelton Graca S Authorized by: Mora BellmanMERRELL, Kirtis Challis S Consent: Verbal consent obtained. written consent obtained. The procedure was performed in an emergent situation. Risks and benefits: risks, benefits and alternatives were discussed Consent given by: patient Patient understanding: patient states understanding of the procedure being performed Patient consent: the patient's understanding of the procedure matches consent given Required items: required blood products, implants, devices, and special equipment available Patient identity confirmed: verbally with patient and arm band Time out: Immediately prior to procedure a "time out" was called to verify the correct patient, procedure, equipment, support staff and site/side marked as required. Patient sedated: yes Sedatives: see MAR for details Vitals: Vital signs were monitored during sedation. Patient tolerance: Patient tolerated the procedure well with no immediate complications. Comments: Successful reduction of right shoulder   (including critical care time) Labs Review Labs Reviewed - No data to display  Imaging Review Dg Shoulder Right  06/20/2014   CLINICAL DATA:  Right shoulder injury lifting weights.  EXAM: RIGHT SHOULDER - 2+ VIEW  COMPARISON:  Plain films right shoulder 05/04/2005.  FINDINGS: The shoulder is anteriorly dislocated. No fracture is identified. The acromioclavicular joint is intact. Image right lung and ribs are unremarkable.  IMPRESSION: Anterior right shoulder dislocation.   Electronically Signed   By: Drusilla Kannerhomas  Dalessio M.D.   On: 06/20/2014 21:32   Dg Shoulder Right Port  06/20/2014   CLINICAL DATA:  Postreduction.  EXAM: PORTABLE RIGHT SHOULDER - 2+ VIEW  COMPARISON:   06/20/2014  FINDINGS: No acute bony abnormality. Specifically, no fracture, subluxation, or dislocation. Soft tissues are intact. Interval reduction of the previously seen shoulder dislocation.  IMPRESSION: Interval reduction.  No acute bony abnormality.   Electronically Signed   By: Charlett NoseKevin  Dover M.D.   On: 06/20/2014 22:50     EKG Interpretation None      MDM   Final diagnoses:  Shoulder dislocation, right, initial encounter    Patient presents to ED with right shoulder dislocation. This is the first time patient dislocated. Successfully reduced in ED by myself. Shoulder sling with immobilization placed. Patient is established at [facility] and will follow up there. He is neurovascularly intact. Compartment soft. Grip strength 5/5 on right. Discussed reasons to return to ED immediately. Discussed no driving or combining narcotics with alcohol. Vital signs stable for discharge. Dr. Lynelle DoctorKnapp evaluated patient and agrees with plan. Patient / Family / Caregiver informed of clinical course, understand medical decision-making process, and agree with plan.   Mora BellmanHannah S Teana Lindahl, PA-C 06/21/14 [phone]

## 2014-06-20 NOTE — ED Notes (Signed)
Patient is alert and oriented x3.  He states that he was lifting weights when his shoulder pop and he has severe right shoulder pain. Currently he rates his pain 10 of 10.  He denies this ever happening before.

## 2014-06-20 NOTE — Discharge Instructions (Signed)
Shoulder Dislocation  Your shoulder is made up of three bones: the collar bone (clavicle); the shoulder blade (scapula), which includes the socket (glenoid cavity); and the upper arm bone (humerus). Your shoulder joint is the place where these bones meet. Strong, fibrous tissues hold these bones together (ligaments). Muscles and strong, fibrous tissues that connect the muscles to these bones (tendons) allow your arm to move through this joint. The range of motion of your shoulder joint is more extensive than most of your other joints, and the glenoid cavity is very shallow. That is the reason that your shoulder joint is one of the most unstable joints in your body. It is far more prone to dislocation than your other joints. Shoulder dislocation is when your humerus is forced out of your shoulder joint.  CAUSES  Shoulder dislocation is caused by a forceful impact on your shoulder. This impact usually is from an injury, such as a sports injury or a fall.  SYMPTOMS  Symptoms of shoulder dislocation include:  · Deformity of your shoulder.  · Intense pain.  · Inability to move your shoulder joint.  · Numbness, weakness, or tingling around your shoulder joint (your neck or down your arm).  · Bruising or swelling around your shoulder.  DIAGNOSIS  In order to diagnose a dislocated shoulder, your caregiver will perform a physical exam. Your caregiver also may have an X-ray exam done to see if you have any broken bones. Magnetic resonance imaging (MRI) is a procedure that sometimes is done to help your caregiver see any damage to the soft tissues around your shoulder, particularly your rotator cuff tendons. Additionally, your caregiver also may have electromyography done to measure the electrical discharges produced in your muscles if you have signs or symptoms of nerve damage.  TREATMENT  A shoulder dislocation is treated by placing the humerus back in the joint (reduction). Your caregiver does this either manually (closed  reduction), by moving your humerus back into the joint through manipulation, or through surgery (open reduction). When your humerus is back in place, severe pain should improve almost immediately.  You also may need to have surgery if you have a weak shoulder joint or ligaments, and you have recurring shoulder dislocations, despite rehabilitation. In rare cases, surgery is necessary if your nerves or blood vessels are damaged during the dislocation.  After your reduction, your arm will be placed in a shoulder immobilizer or sling to keep it from moving. Your caregiver will have you wear your shoulder immobilizer or sling for 3 days to 3 weeks, depending on how serious your dislocation is. When your shoulder immobilizer or sling is removed, your caregiver may prescribe physical therapy to help improve the range of motion in your shoulder joint.  HOME CARE INSTRUCTIONS   The following measures can help to reduce pain and speed up the healing process:  · Rest your injured joint. Do not move it. Avoid activities similar to the one that caused your injury.  · Apply ice to your injured joint for the first day or two after your reduction or as directed by your caregiver. Applying ice helps to reduce inflammation and pain.  ¨ Put ice in a plastic bag.  ¨ Place a towel between your skin and the bag.  ¨ Leave the ice on for 15-20 minutes at a time, every 2 hours while you are awake.  · Exercise your hand by squeezing a soft ball. This helps to eliminate stiffness and swelling in your hand and   wrist.  · Take over-the-counter or prescription medicine for pain or discomfort as told by your caregiver.  SEEK IMMEDIATE MEDICAL CARE IF:   · Your shoulder immobilizer or sling becomes damaged.  · Your pain becomes worse rather than better.  · You lose feeling in your arm or hand, or they become white and cold.  MAKE SURE YOU:   · Understand these instructions.  · Will watch your condition.  · Will get help right away if you are not  doing well or get worse.  Document Released: 07/19/2001 Document Revised: 03/10/2014 Document Reviewed: 08/14/2011  Exit[employer]® Patient Information ©2015 [employer]. This information is not intended to replace advice given to you by your health care provider. Make sure you discuss any questions you have with your health care provider.

## 2014-06-25 ENCOUNTER — Encounter (HOSPITAL_COMMUNITY): Payer: Self-pay | Admitting: Emergency Medicine

## 2014-06-25 ENCOUNTER — Emergency Department (HOSPITAL_COMMUNITY)
Admission: EM | Admit: 2014-06-25 | Discharge: 2014-06-25 | Disposition: A | Discharge status: Home / Self Care | Payer: Self-pay | Attending: Emergency Medicine | Admitting: Emergency Medicine

## 2014-06-25 ENCOUNTER — Emergency Department (HOSPITAL_COMMUNITY): Payer: Self-pay

## 2014-06-25 DIAGNOSIS — F17200 Nicotine dependence, unspecified, uncomplicated: Secondary | ICD-10-CM | POA: Insufficient documentation

## 2014-06-25 DIAGNOSIS — G8911 Acute pain due to trauma: Secondary | ICD-10-CM | POA: Insufficient documentation

## 2014-06-25 DIAGNOSIS — M25511 Pain in right shoulder: Secondary | ICD-10-CM

## 2014-06-25 DIAGNOSIS — M25519 Pain in unspecified shoulder: Secondary | ICD-10-CM | POA: Insufficient documentation

## 2014-06-25 MED ORDER — IBUPROFEN 800 MG PO TABS: 800.0000 mg | ORAL_TABLET | Freq: Three times a day (TID) | ORAL | Status: AC

## 2014-06-25 MED ORDER — HYDROMORPHONE HCL PF 1 MG/ML IJ SOLN
1.0000 mg | Freq: Once | INTRAMUSCULAR | Status: AC
  Administered 2014-06-25: 1 mg via INTRAVENOUS
  Filled 2014-06-25: qty 1

## 2014-06-25 MED ORDER — OXYCODONE-ACETAMINOPHEN 5-325 MG PO TABS: 1.0000 | ORAL_TABLET | Freq: Four times a day (QID) | ORAL | Status: DC | PRN

## 2014-06-25 NOTE — ED Notes (Signed)
Pt reports he was seen in ED on 8/14 for right shoulder injury. Reports since then pt has had increased shoulder pain and should keeps popping. Pt wear splint at this time. Reports pain 7/10.

## 2014-06-25 NOTE — Discharge Instructions (Signed)
As discussed, your ongoing shoulder pain is likely due to soft tissue damage possibly sustained during the dislocation.  There is very important to follow up with your orthopedist.  Please return here for concerning changes in your condition.

## 2014-06-25 NOTE — ED Provider Notes (Signed)
CSN: [id number]     Arrival date & time 06/25/14  1208 History   First MD Initiated Contact with Patient 06/25/14 1300     Chief Complaint  Patient presents with  . Shoulder Injury      HPI  Patient presents with ongoing right shoulder pain, worse over the past day. Patient had an injury 3 days ago, was seen here, with reduction of the shoulder under conscious sedation. Patient was lifting weights, doing a bench press, when he felt his shoulder give way, subsequently developed severe pain in the shoulder. Since a reduction several days ago the pain has been persistent, worse with palpation or motion. Pain radiates towards the midline. There is no associated neck pain, elbow or wrist pain, or other focal complaints. Patient has minimal improvement with provided narcotics.  History reviewed. No pertinent past medical history. Past Surgical History  Procedure Laterality Date  . I&d extremity Left 02/04/2014    Procedure: IRRIGATION AND DEBRIDEMENT LEFT FOOT ABSCESS;  Surgeon: Cheral Almas, MD;  Location: [facility];  Service: Orthopedics;  Laterality: Left;  . I&d extremity Left 02/07/2014    Procedure: IRRIGATION AND DEBRIDEMENT LEFT FOOT, WOUND VAC;  Surgeon: Cheral Almas, MD;  Location: [facility];  Service: Orthopedics;  Laterality: Left;  . Application of wound vac Left 02/07/2014    Procedure: APPLICATION OF WOUND VAC;  Surgeon: Cheral Almas, MD;  Location: [facility];  Service: Orthopedics;  Laterality: Left;  . I&d extremity Left 02/11/2014    Procedure: IRRIGATION AND DEBRIDEMENT LEFT FOOT, SKIN GRAFT TAKEN FROM LEFT UPPER THIGH;  Surgeon: Cheral Almas, MD;  Location: [facility];  Service: Orthopedics;  Laterality: Left;  . Foot surgery     History reviewed. No pertinent family history. History  Substance Use Topics  . Smoking status: Current Every Day Smoker -- 0.25 packs/day for 8 years    Types: Cigarettes  . Smokeless tobacco: Never Used  . Alcohol Use: No     Review of Systems  All other systems reviewed and are negative.     Allergies  Review of patient's allergies indicates no known allergies.  Home Medications   Prior to Admission medications   Medication Sig Start Date End Date Taking? Authorizing Provider  Aspirin-Salicylamide-Caffeine (BC HEADACHE POWDER PO) Take 1 packet by mouth daily as needed (pain.).   Yes Historical Provider, MD  oxyCODONE-acetaminophen (PERCOCET/ROXICET) 5-325 MG per tablet Take 1-2 tablets by mouth every 6 (six) hours as needed for moderate pain or severe pain. 06/20/14  Yes Mora Bellman, PA-C  promethazine (PHENERGAN) 25 MG tablet Take 1 tablet (25 mg total) by mouth every 6 (six) hours as needed for nausea or vomiting. 06/20/14   Mora Bellman, PA-C   BP 137/95  Pulse 75  Temp(Src) 98.4 F (36.9 C) (Oral)  Resp 16  SpO2 100% Physical Exam  Nursing note and vitals reviewed. Constitutional: He is oriented to person, place, and time. He appears well-developed. No distress.  HENT:  Head: Normocephalic and atraumatic.  Eyes: Conjunctivae and EOM are normal.  Cardiovascular: Normal rate and regular rhythm.   Pulmonary/Chest: Effort normal. No stridor. No respiratory distress.  Abdominal: He exhibits no distension.  Musculoskeletal: He exhibits no edema.  Right shoulder in sling, with exquisite tenderness to palpation about the anterior aspect.  Patient is hesitant to move the shoulder at all secondary to pain. Right elbow, right wrist unremarkable, though the exam is somewhat limited secondary to unwillingness to move the arm.  Neurological: He is alert and oriented to person, place, and time.  Skin: Skin is warm and dry.  Psychiatric: He has a normal mood and affect.    ED Course  Procedures (including critical care time)  Imaging Review Dg Shoulder Right  06/25/2014   CLINICAL DATA:  Persistent shoulder pain, recent dislocation on 06/20/2014  EXAM: RIGHT SHOULDER - 2+ VIEW  COMPARISON:   Prior radiographs of the right shoulder 06/20/2014  FINDINGS: There is no evidence of fracture or dislocation. There is no evidence of arthropathy or other focal bone abnormality. Soft tissues are unremarkable.  IMPRESSION: Negative.   Electronically Signed   By: Malachy MoanHeath  McCullough M.D.   On: 06/25/2014 13:09   I reviewed the XR, demonstrated it to the patient. XR seems c/w ligamentous injury, with possible AC separation.  MDM  Patient presents with ongoing shoulder pain.  Patient's description of injury suggests ligamentous damage.  Patient's distal neurovascular status is appropriate, is otherwise in generally good health. The patient had improvement in his condition with narcotics here, was discharged with an increased regimen of analgesics, to followup with his orthopedist.   Gerhard Munchobert Orenthal Debski, MD 06/25/14 1336

## 2015-08-16 ENCOUNTER — Encounter (HOSPITAL_COMMUNITY): Payer: Self-pay | Admitting: Emergency Medicine

## 2015-08-16 ENCOUNTER — Emergency Department (HOSPITAL_COMMUNITY): Payer: Self-pay

## 2015-08-16 ENCOUNTER — Emergency Department (HOSPITAL_COMMUNITY)
Admission: EM | Admit: 2015-08-16 | Discharge: 2015-08-16 | Disposition: A | Discharge status: Home / Self Care | Payer: Self-pay | Attending: Emergency Medicine | Admitting: Emergency Medicine

## 2015-08-16 DIAGNOSIS — R1011 Right upper quadrant pain: Secondary | ICD-10-CM | POA: Insufficient documentation

## 2015-08-16 DIAGNOSIS — R112 Nausea with vomiting, unspecified: Secondary | ICD-10-CM | POA: Insufficient documentation

## 2015-08-16 DIAGNOSIS — Z79899 Other long term (current) drug therapy: Secondary | ICD-10-CM | POA: Insufficient documentation

## 2015-08-16 DIAGNOSIS — R1013 Epigastric pain: Secondary | ICD-10-CM | POA: Insufficient documentation

## 2015-08-16 DIAGNOSIS — R1012 Left upper quadrant pain: Secondary | ICD-10-CM | POA: Insufficient documentation

## 2015-08-16 DIAGNOSIS — M546 Pain in thoracic spine: Secondary | ICD-10-CM | POA: Insufficient documentation

## 2015-08-16 DIAGNOSIS — Z720 Tobacco use: Secondary | ICD-10-CM | POA: Insufficient documentation

## 2015-08-16 LAB — COMPREHENSIVE METABOLIC PANEL
ALBUMIN: 4.7 g/dL (ref 3.5–5.0)
ALT: 17 U/L (ref 17–63)
AST: 20 U/L (ref 15–41)
Alkaline Phosphatase: 71 U/L (ref 38–126)
Anion gap: 10 (ref 5–15)
BUN: 35 mg/dL — AB (ref 6–20)
CHLORIDE: 100 mmol/L — AB (ref 101–111)
CO2: 28 mmol/L (ref 22–32)
CREATININE: 1.06 mg/dL (ref 0.61–1.24)
Calcium: 9.5 mg/dL (ref 8.9–10.3)
GFR calc Af Amer: >60 mL/min (ref >60)
GLUCOSE: 111 mg/dL — AB (ref 65–99)
Potassium: 4.1 mmol/L (ref 3.5–5.1)
SODIUM: 138 mmol/L (ref 135–145)
Total Bilirubin: 0.8 mg/dL (ref 0.3–1.2)
Total Protein: 8.7 g/dL — ABNORMAL HIGH (ref 6.5–8.1)

## 2015-08-16 LAB — CBC
HCT: 48.9 % (ref 39.0–52.0)
Hemoglobin: 16.7 g/dL (ref 13.0–17.0)
MCH: 30.9 pg (ref 26.0–34.0)
MCHC: 34.2 g/dL (ref 30.0–36.0)
MCV: 90.6 fL (ref 78.0–100.0)
PLATELETS: 214 10*3/uL (ref 150–400)
RBC: 5.4 MIL/uL (ref 4.22–5.81)
RDW: 13.3 % (ref 11.5–15.5)
WBC: 12.3 10*3/uL — ABNORMAL HIGH (ref 4.0–10.5)

## 2015-08-16 LAB — LIPASE, BLOOD: Lipase: 22 U/L (ref 22–51)

## 2015-08-16 MED ORDER — IOHEXOL 300 MG/ML  SOLN
100.0000 mL | Freq: Once | INTRAMUSCULAR | Status: AC | PRN
  Administered 2015-08-16: 100 mL via INTRAVENOUS

## 2015-08-16 MED ORDER — OXYCODONE-ACETAMINOPHEN 5-325 MG PO TABS: 1.0000 | ORAL_TABLET | ORAL | Status: DC | PRN

## 2015-08-16 MED ORDER — HYDROMORPHONE HCL 1 MG/ML IJ SOLN
1.0000 mg | Freq: Once | INTRAMUSCULAR | Status: AC
  Administered 2015-08-16: 1 mg via INTRAVENOUS
  Filled 2015-08-16: qty 1

## 2015-08-16 MED ORDER — PROMETHAZINE HCL 25 MG PO TABS: 25.0000 mg | ORAL_TABLET | Freq: Four times a day (QID) | ORAL | Status: DC | PRN

## 2015-08-16 MED ORDER — SODIUM CHLORIDE 0.9 % IV SOLN
INTRAVENOUS | Status: DC
  Administered 2015-08-16: 13:00:00 via INTRAVENOUS

## 2015-08-16 MED ORDER — ONDANSETRON HCL 4 MG/2ML IJ SOLN
4.0000 mg | Freq: Once | INTRAMUSCULAR | Status: AC
  Administered 2015-08-16: 4 mg via INTRAVENOUS
  Filled 2015-08-16: qty 2

## 2015-08-16 MED ORDER — SODIUM CHLORIDE 0.9 % IV BOLUS (SEPSIS)
1000.0000 mL | Freq: Once | INTRAVENOUS | Status: AC
  Administered 2015-08-16: 1000 mL via INTRAVENOUS

## 2015-08-16 NOTE — ED Notes (Signed)
Unable to obtain blood with IV access. Gerilyn Pilgrim, NT collecting labs

## 2015-08-16 NOTE — Discharge Instructions (Signed)
Abdominal Pain, Adult °Many things can cause abdominal pain. Usually, abdominal pain is not caused by a disease and will improve without treatment. It can often be observed and treated at home. Your health care provider will do a physical exam and possibly order blood tests and X-rays to help determine the seriousness of your pain. However, in many cases, more time must pass before a clear cause of the pain can be found. Before that point, your health care provider may not know if you need more testing or further treatment. °HOME CARE INSTRUCTIONS °Monitor your abdominal pain for any changes. The following actions may help to alleviate any discomfort you are experiencing: °· Only take over-the-counter or prescription medicines as directed by your health care provider. °· Do not take laxatives unless directed to do so by your health care provider. °· Try a clear liquid diet (broth, tea, or water) as directed by your health care provider. Slowly move to a bland diet as tolerated. °SEEK MEDICAL CARE IF: °· You have unexplained abdominal pain. °· You have abdominal pain associated with nausea or diarrhea. °· You have pain when you urinate or have a bowel movement. °· You experience abdominal pain that wakes you in the night. °· You have abdominal pain that is worsened or improved by eating food. °· You have abdominal pain that is worsened with eating fatty foods. °· You have a fever. °SEEK IMMEDIATE MEDICAL CARE IF: °· Your pain does not go away within 2 hours. °· You keep throwing up (vomiting). °· Your pain is felt only in portions of the abdomen, such as the right side or the left lower portion of the abdomen. °· You pass bloody or black tarry stools. °MAKE SURE YOU: °· Understand these instructions. °· Will watch your condition. °· Will get help right away if you are not doing well or get worse. °  °This information is not intended to replace advice given to you by your health care provider. Make sure you discuss  any questions you have with your health care provider. °  °Document Released: 08/03/2005 Document Revised: 07/15/2015 Document Reviewed: 07/03/2013 °Elsevier Interactive Patient Education ©2016 [employer] ° °Nausea and Vomiting °Nausea is a sick feeling that often comes before throwing up (vomiting). Vomiting is a reflex where stomach contents come out of your mouth. Vomiting can cause severe loss of body fluids (dehydration). Children and elderly adults can become dehydrated quickly, especially if they also have diarrhea. Nausea and vomiting are symptoms of a condition or disease. It is important to find the cause of your symptoms. °CAUSES  °· Direct irritation of the stomach lining. This irritation can result from increased acid production (gastroesophageal reflux disease), infection, food poisoning, taking certain medicines (such as nonsteroidal anti-inflammatory drugs), alcohol use, or tobacco use. °· Signals from the brain. These signals could be caused by a headache, heat exposure, an inner ear disturbance, increased pressure in the brain from injury, infection, a tumor, or a concussion, pain, emotional stimulus, or metabolic problems. °· An obstruction in the gastrointestinal tract (bowel obstruction). °· Illnesses such as diabetes, hepatitis, gallbladder problems, appendicitis, kidney problems, cancer, sepsis, atypical symptoms of a heart attack, or eating disorders. °· Medical treatments such as chemotherapy and radiation. °· Receiving medicine that makes you sleep (general anesthetic) during surgery. °DIAGNOSIS °Your caregiver may ask for tests to be done if the problems do not improve after a few days. Tests may also be done if symptoms are severe or if the reason for the   nausea and vomiting is not clear. Tests may include: °· Urine tests. °· Blood tests. °· Stool tests. °· Cultures (to look for evidence of infection). °· X-rays or other imaging studies. °Test results can help your caregiver make  decisions about treatment or the need for additional tests. °TREATMENT °You need to stay well hydrated. Drink frequently but in small amounts. You may wish to drink water, sports drinks, clear broth, or eat frozen ice pops or gelatin dessert to help stay hydrated. When you eat, eating slowly may help prevent nausea. There are also some antinausea medicines that may help prevent nausea. °HOME CARE INSTRUCTIONS  °· Take all medicine as directed by your caregiver. °· If you do not have an appetite, do not force yourself to eat. However, you must continue to drink fluids. °· If you have an appetite, eat a normal diet unless your caregiver tells you differently. °¨ Eat a variety of complex carbohydrates (rice, wheat, potatoes, bread), lean meats, yogurt, fruits, and vegetables. °¨ Avoid high-fat foods because they are more difficult to digest. °· Drink enough water and fluids to keep your urine clear or pale yellow. °· If you are dehydrated, ask your caregiver for specific rehydration instructions. Signs of dehydration may include: °¨ Severe thirst. °¨ Dry lips and mouth. °¨ Dizziness. °¨ Dark urine. °¨ Decreasing urine frequency and amount. °¨ Confusion. °¨ Rapid breathing or pulse. °SEEK IMMEDIATE MEDICAL CARE IF:  °· You have blood or brown flecks (like coffee grounds) in your vomit. °· You have black or bloody stools. °· You have a severe headache or stiff neck. °· You are confused. °· You have severe abdominal pain. °· You have chest pain or trouble breathing. °· You do not urinate at least once every 8 hours. °· You develop cold or clammy skin. °· You continue to vomit for longer than 24 to 48 hours. °· You have a fever. °MAKE SURE YOU:  °· Understand these instructions. °· Will watch your condition. °· Will get help right away if you are not doing well or get worse. °  °This information is not intended to replace advice given to you by your health care provider. Make sure you discuss any questions you have with  your health care provider. °  °Document Released: 10/24/2005 Document Revised: 01/16/2012 Document Reviewed: 03/23/2011 °Elsevier Interactive Patient Education ©2016 [employer] ° °

## 2015-08-16 NOTE — ED Provider Notes (Signed)
CSN: [id number]     Arrival date & time 08/16/15  1205 History   First MD Initiated Contact with Patient 08/16/15 1222     Chief Complaint  Patient presents with  . Emesis  . Abdominal Pain  . Back Pain     (Consider location/radiation/quality/duration/timing/severity/associated sxs/prior Treatment) HPI   Jason Duran is a 29 y.o. male who is here for evaluation of vomiting for 3 days, without diarrhea. He also has upper back pain and sore throat. No known sick exposures, or abnormal food ingestions. He denies similar problems in the past. No generalized weakness, dizziness, paresthesias or focal weakness. There are no other known modifying factors   History reviewed. No pertinent past medical history. Past Surgical History  Procedure Laterality Date  . I&d extremity Left 02/04/2014    Procedure: IRRIGATION AND DEBRIDEMENT LEFT FOOT ABSCESS;  Surgeon: Cheral Almas, MD;  Location: [facility];  Service: Orthopedics;  Laterality: Left;  . I&d extremity Left 02/07/2014    Procedure: IRRIGATION AND DEBRIDEMENT LEFT FOOT, WOUND VAC;  Surgeon: Cheral Almas, MD;  Location: [facility];  Service: Orthopedics;  Laterality: Left;  . Application of wound vac Left 02/07/2014    Procedure: APPLICATION OF WOUND VAC;  Surgeon: Cheral Almas, MD;  Location: [facility];  Service: Orthopedics;  Laterality: Left;  . I&d extremity Left 02/11/2014    Procedure: IRRIGATION AND DEBRIDEMENT LEFT FOOT, SKIN GRAFT TAKEN FROM LEFT UPPER THIGH;  Surgeon: Cheral Almas, MD;  Location: [facility];  Service: Orthopedics;  Laterality: Left;  . Foot surgery     No family history on file. Social History  Substance Use Topics  . Smoking status: Current Every Day Smoker -- 0.25 packs/day for 8 years    Types: Cigarettes  . Smokeless tobacco: Never Used  . Alcohol Use: No    Review of Systems  All other systems reviewed and are negative.     Allergies  Review of patient's allergies indicates no known  allergies.  Home Medications   Prior to Admission medications   Medication Sig Start Date End Date Taking? Authorizing Provider  RaNITidine HCl (ACID REDUCER PO) Take 2 tablets by mouth daily.   Yes Historical Provider, MD  oxyCODONE-acetaminophen (PERCOCET) 5-325 MG tablet Take 1 tablet by mouth every 4 (four) hours as needed for severe pain. 08/16/15   Mancel Bale, MD  promethazine (PHENERGAN) 25 MG tablet Take 1 tablet (25 mg total) by mouth every 6 (six) hours as needed for nausea or vomiting. 08/16/15   Mancel Bale, MD   BP 161/69 mmHg  Pulse 50  Temp(Src) 98 F (36.7 C) (Oral)  Resp 18  SpO2 99% Physical Exam  Constitutional: He is oriented to person, place, and time. He appears well-developed and well-nourished.  HENT:  Head: Normocephalic and atraumatic.  Right Ear: External ear normal.  Left Ear: External ear normal.  Eyes: Conjunctivae and EOM are normal. Pupils are equal, round, and reactive to light.  Neck: Normal range of motion and phonation normal. Neck supple.  Cardiovascular: Normal rate, regular rhythm and normal heart sounds.   Pulmonary/Chest: Effort normal and breath sounds normal. He exhibits no bony tenderness.  Abdominal: Soft. He exhibits no mass. There is tenderness (Epigastric, left and right upper quadrant,  moderate.). There is no rebound and no guarding.  Vomiting saliva, during examination  Musculoskeletal: Normal range of motion. He exhibits no edema or tenderness.  Neurological: He is alert and oriented to person, place, and time. No  cranial nerve deficit or sensory deficit. He exhibits normal muscle tone. Coordination normal.  Skin: Skin is warm, dry and intact.  Psychiatric: He has a normal mood and affect. His behavior is normal. Judgment and thought content normal.  Nursing note and vitals reviewed.   ED Course  Procedures (including critical care time)  Medications  0.9 %  sodium chloride infusion ( Intravenous New Bag/Given 08/16/15 1317)   sodium chloride 0.9 % bolus 1,000 mL (0 mLs Intravenous Stopped 08/16/15 1508)  ondansetron (ZOFRAN) injection 4 mg (4 mg Intravenous Given 08/16/15 1316)  HYDROmorphone (DILAUDID) injection 1 mg (1 mg Intravenous Given 08/16/15 1316)  iohexol (OMNIPAQUE) 300 MG/ML solution 100 mL (100 mLs Intravenous Contrast Given 08/16/15 1440)    Patient Vitals for the past 24 hrs:  BP Temp Temp src Pulse Resp SpO2  08/16/15 1423 161/69 mmHg - - (!) 50 18 99 %  08/16/15 1216 146/74 mmHg 98 F (36.7 C) Oral (!) 52 17 100 %    4:21 PM Reevaluation with update and discussion. After initial assessment and treatment, an updated evaluation reveals he feels better at this time. Findings discussed with patient and wife, all questions answered. Jason Duran      Labs Review Labs Reviewed  COMPREHENSIVE METABOLIC PANEL - Abnormal; Notable for the following:    Chloride 100 (*)    Glucose, Bld 111 (*)    BUN 35 (*)    Total Protein 8.7 (*)    All other components within normal limits  CBC - Abnormal; Notable for the following:    WBC 12.3 (*)    All other components within normal limits  LIPASE, BLOOD  URINALYSIS, ROUTINE W REFLEX MICROSCOPIC (NOT AT [facility])  URINE RAPID DRUG SCREEN, HOSP PERFORMED    Imaging Review Ct Abdomen Pelvis W Contrast  08/16/2015   CLINICAL DATA:  Patient with upper abdominal pain, nausea and vomiting.  EXAM: CT ABDOMEN AND PELVIS WITH CONTRAST  TECHNIQUE: Multidetector CT imaging of the abdomen and pelvis was performed using the standard protocol following bolus administration of intravenous contrast.  CONTRAST:  OMNIPAQUE IOHEXOL 300 MG/ML  SOLN  COMPARISON:  CT abdomen pelvis 02/19/2014  FINDINGS: Lower chest: No consolidative or nodular pulmonary opacities. Normal heart size.  Hepatobiliary: Liver is normal in size and contour. Suggestion of hepatic steatosis with sparing about the gallbladder. There is a 7 mm sub capsular hyper enhancing lesion within the left hepatic  lobe (image 24; series 2), most compatible with flash filling hemangioma. Gallbladder is unremarkable. No intrahepatic or extrahepatic biliary ductal dilatation.  Pancreas: Possible pancreatic divisum. Normal pancreatic parenchyma. No ductal dilatation.  Spleen: Unremarkable  Adrenals/Urinary Tract: The adrenal glands are normal. Kidneys enhance symmetrically with contrast. No hydronephrosis. Urinary bladder is unremarkable.  Stomach/Bowel: No abnormal bowel wall thickening or evidence for bowel obstruction. The appendix is normal. No free fluid or free intraperitoneal air.  Vascular/Lymphatic: Normal caliber abdominal aorta. No retroperitoneal lymphadenopathy.  Other: Prostate is unremarkable.  Musculoskeletal: No aggressive or acute appearing osseous lesions.  IMPRESSION: No acute process within the abdomen or pelvis.   Electronically Signed   By: Annia Belt M.D.   On: 08/16/2015 15:04   I have personally reviewed and evaluated these images and lab results as part of my medical decision-making.   EKG Interpretation None      MDM   Final diagnoses:  Non-intractable vomiting with nausea, vomiting of unspecified type  Epigastric pain    Nonspecific, vomiting and epigastric pain. Suspect  viral process. No evidence for severe spectral infection or metabolic instability.    Nursing Notes Reviewed/ Care Coordinated Applicable Imaging Reviewed Interpretation of Laboratory Data incorporated into ED treatment  The patient appears reasonably screened and/or stabilized for discharge and I doubt any other medical condition or other [facility] requiring further screening, evaluation, or treatment in the ED at this time prior to discharge.  Plan: Home Medications- Percocet, Zofran; Home Treatments- rest; return here if the recommended treatment, does not improve the symptoms; Recommended follow up- PCP prn   Mancel Bale, MD 08/16/15 1622

## 2015-08-16 NOTE — ED Notes (Signed)
Pt been vomiting since Friday. Pt c/o abd pain and back pain as well as throat pain from vomiting. Pt's visitor has been trying to keep pt hydrated with soup, ginger ale.

## 2017-01-19 ENCOUNTER — Emergency Department (HOSPITAL_COMMUNITY)
Admission: EM | Admit: 2017-01-19 | Discharge: 2017-01-19 | Disposition: A | Discharge status: Home / Self Care | Payer: Self-pay | Attending: Emergency Medicine | Admitting: Emergency Medicine

## 2017-01-19 ENCOUNTER — Ambulatory Visit (HOSPITAL_COMMUNITY)
Admission: RE | Admit: 2017-01-19 | Discharge: 2017-01-19 | Disposition: A | Discharge status: Home / Self Care | Payer: Self-pay | Attending: Psychiatry | Admitting: Psychiatry

## 2017-01-19 ENCOUNTER — Encounter (HOSPITAL_COMMUNITY): Payer: Self-pay | Admitting: Emergency Medicine

## 2017-01-19 DIAGNOSIS — Z79891 Long term (current) use of opiate analgesic: Secondary | ICD-10-CM

## 2017-01-19 DIAGNOSIS — F17210 Nicotine dependence, cigarettes, uncomplicated: Secondary | ICD-10-CM | POA: Insufficient documentation

## 2017-01-19 DIAGNOSIS — F1290 Cannabis use, unspecified, uncomplicated: Secondary | ICD-10-CM

## 2017-01-19 DIAGNOSIS — F1010 Alcohol abuse, uncomplicated: Secondary | ICD-10-CM | POA: Insufficient documentation

## 2017-01-19 DIAGNOSIS — F1410 Cocaine abuse, uncomplicated: Secondary | ICD-10-CM

## 2017-01-19 DIAGNOSIS — Z79899 Other long term (current) drug therapy: Secondary | ICD-10-CM | POA: Insufficient documentation

## 2017-01-19 DIAGNOSIS — F1414 Cocaine abuse with cocaine-induced mood disorder: Secondary | ICD-10-CM | POA: Diagnosis present

## 2017-01-19 LAB — CBC WITH DIFFERENTIAL/PLATELET
BASOS ABS: 0 10*3/uL (ref 0.0–0.1)
Basophils Relative: 0 %
EOS ABS: 0.1 10*3/uL (ref 0.0–0.7)
EOS PCT: 2 %
HCT: 44.3 % (ref 39.0–52.0)
Hemoglobin: 15.4 g/dL (ref 13.0–17.0)
Lymphocytes Relative: 34 %
Lymphs Abs: 1.9 10*3/uL (ref 0.7–4.0)
MCH: 31.1 pg (ref 26.0–34.0)
MCHC: 34.8 g/dL (ref 30.0–36.0)
MCV: 89.5 fL (ref 78.0–100.0)
MONO ABS: 0.5 10*3/uL (ref 0.1–1.0)
Monocytes Relative: 9 %
Neutro Abs: 3.1 10*3/uL (ref 1.7–7.7)
Neutrophils Relative %: 55 %
PLATELETS: 218 10*3/uL (ref 150–400)
RBC: 4.95 MIL/uL (ref 4.22–5.81)
RDW: 13 % (ref 11.5–15.5)
WBC: 5.7 10*3/uL (ref 4.0–10.5)

## 2017-01-19 LAB — RAPID URINE DRUG SCREEN, HOSP PERFORMED
AMPHETAMINES: NOT DETECTED
BENZODIAZEPINES: NOT DETECTED
Barbiturates: NOT DETECTED
Cocaine: POSITIVE — AB
OPIATES: NOT DETECTED
Tetrahydrocannabinol: NOT DETECTED

## 2017-01-19 LAB — COMPREHENSIVE METABOLIC PANEL
ALK PHOS: 46 U/L (ref 38–126)
ALT: 26 U/L (ref 17–63)
ANION GAP: 5 (ref 5–15)
AST: 33 U/L (ref 15–41)
Albumin: 4.2 g/dL (ref 3.5–5.0)
BILIRUBIN TOTAL: 0.5 mg/dL (ref 0.3–1.2)
BUN: 15 mg/dL (ref 6–20)
CALCIUM: 8.8 mg/dL — AB (ref 8.9–10.3)
CO2: 24 mmol/L (ref 22–32)
CREATININE: 1.22 mg/dL (ref 0.61–1.24)
Chloride: 109 mmol/L (ref 101–111)
GFR calc Af Amer: >60 mL/min (ref >60)
Glucose, Bld: 103 mg/dL — ABNORMAL HIGH (ref 65–99)
Potassium: 4.2 mmol/L (ref 3.5–5.1)
SODIUM: 138 mmol/L (ref 135–145)
TOTAL PROTEIN: 6.6 g/dL (ref 6.5–8.1)

## 2017-01-19 LAB — ETHANOL: Alcohol, Ethyl (B): <5 mg/dL (ref <5)

## 2017-01-19 LAB — ACETAMINOPHEN LEVEL: Acetaminophen (Tylenol), Serum: <10 ug/mL — ABNORMAL LOW (ref 10–30)

## 2017-01-19 LAB — SALICYLATE LEVEL

## 2017-01-19 MED ORDER — THIAMINE HCL 100 MG/ML IJ SOLN: 100.0000 mg | Freq: Every day | INTRAMUSCULAR | Status: DC

## 2017-01-19 MED ORDER — VITAMIN B-1 100 MG PO TABS
100.0000 mg | ORAL_TABLET | Freq: Every day | ORAL | Status: DC
  Administered 2017-01-19: 100 mg via ORAL
  Filled 2017-01-19: qty 1

## 2017-01-19 MED ORDER — ACETAMINOPHEN 325 MG PO TABS: 650.0000 mg | ORAL_TABLET | ORAL | Status: DC | PRN

## 2017-01-19 MED ORDER — LORAZEPAM 1 MG PO TABS: 0.0000 mg | ORAL_TABLET | Freq: Two times a day (BID) | ORAL | Status: DC

## 2017-01-19 MED ORDER — LORAZEPAM 1 MG PO TABS: 0.0000 mg | ORAL_TABLET | Freq: Four times a day (QID) | ORAL | Status: DC

## 2017-01-19 MED ORDER — IBUPROFEN 200 MG PO TABS: 600.0000 mg | ORAL_TABLET | Freq: Three times a day (TID) | ORAL | Status: DC | PRN

## 2017-01-19 NOTE — ED Triage Notes (Signed)
Patient sent over by behavioral health for medical clearance.  Patient wants rehab for cocaine use.  He denies homicidal or suicidal ideation or plan.

## 2017-01-19 NOTE — BH Assessment (Signed)
Per Donell SievertSpencer Simon, PA - patient meets criteria for inpatient hospitalization.   Per Donell SievertSpencer Simon, PA - patient needs to be medically cleared.  [facility]

## 2017-01-19 NOTE — [facility] Suicide Risk Assessment (Signed)
Suicide Risk Assessment  Discharge Assessment   [facility]   Principal Problem: Cocaine abuse with cocaine-induced mood disorder [facility](HCC) Discharge Diagnoses:  Patient Active Problem List   Diagnosis Date Noted  . Cocaine abuse with cocaine-induced mood disorder [facility](HCC) [F14.14] 01/19/2017    Priority: High  . Onychomycosis [B35.1] 02/02/2014  . Soft tissue infection of foot [L08.9] 02/01/2014  . Sepsis (HCC) [A41.9] 02/01/2014  . Leukocytosis [D72.829] 02/01/2014  . Hyperkalemia [E87.5] 02/01/2014  . Cellulitis of left foot [L03.116] 02/01/2014    Total Time spent with patient: 45 minutes  Musculoskeletal: Strength & Muscle Tone: within normal limits Gait & Station: normal Patient leans: N/A  Psychiatric Specialty Exam: Physical Exam  Constitutional: He is oriented to person, place, and time. He appears well-developed and well-nourished.  HENT:  Head: Normocephalic.  Respiratory: Effort normal.  Musculoskeletal: Normal range of motion.  Neurological: He is alert and oriented to person, place, and time.  Psychiatric: His speech is normal and behavior is normal. Judgment and thought content normal. Cognition and memory are normal. He exhibits a depressed mood.    Review of Systems  Psychiatric/Behavioral: Positive for depression and substance abuse.  All other systems reviewed and are negative.   Blood pressure 110/60, pulse 65, temperature 97.7 F (36.5 C), temperature source Oral, resp. rate 20, height 5\' 10"  (1.778 m), weight 63.5 kg (140 lb), SpO2 99 %.Body mass index is 20.09 kg/m.  General Appearance: Casual  Eye Contact:  Good  Speech:  Normal Rate  Volume:  Normal  Mood:  Depressed, mild  Affect:  Congruent  Thought Process:  Coherent and Descriptions of Associations: Intact  Orientation:  Full (Time, Place, and Person)  Thought Content:  WDL and Logical  Suicidal Thoughts:  No  Homicidal Thoughts:  No  Memory:  Immediate;    Good Recent;   Good Remote;   Good  Judgement:  Fair  Insight:  Fair  Psychomotor Activity:  Normal  Concentration:  Concentration: Good and Attention Span: Good  Recall:  Good  Fund of Knowledge:  Fair  Language:  Good  Akathisia:  No  Handed:  Right  AIMS (if indicated):     Assets:  Housing Leisure Time Physical Health Resilience Social Support  ADL's:  Intact  Cognition:  WNL  Sleep:      Mental Status Per Nursing Assessment::   On Admission:   cocaine and alcohol abuse  Demographic Factors:  Male and Adolescent or young adult  Loss Factors: Legal issues  Historical Factors: NA  Risk Reduction Factors:   Sense of responsibility to family, Living with another person, especially a relative and Positive social support  Continued Clinical Symptoms:  Depression, mild  Cognitive Features That Contribute To Risk:  None    Suicide Risk:  Minimal: No identifiable suicidal ideation.  Patients presenting with no risk factors but with morbid ruminations; may be classified as minimal risk based on the severity of the depressive symptoms    Plan Of Care/Follow-up recommendations:  Activity:  as tolerated Diet:  heart healthy diet  Courtland Reas, NP 01/19/2017, 10:51 AM

## 2017-01-19 NOTE — BH Assessment (Signed)
Tele Assessment Note   Jason Duran is an 31 y.o. male, African American, Single who presents to [facility] as a walk-in with parents present. Primary c/o S.A. With concerning behaviors regarding addiction. Patient states that he resides with grandmother or wherever else he can lay his head. Patient has no hx. Of mental illness. Patient denies current SI/HI and AVH. Patient acknowledges hx. Of S.A. With cannabis, last use today unspecified amount, alcohol with x 6 40 oz. X 3 16 oz. And a 28 oz, Cocaine last use today with "a 50". Patient has no inpatient psych hx. And is not seen outpatient. Patient is dressed in normal attire with upheaval and tired appearance.Patient  is alert and oriented x4. Patient speech was within normal limits and motor behavior appeared normal. Patient thought process is coherent. Patient does not appear to be responding to internal stimuli. Patient was cooperative throughout the assessment and states that he is agreeable to inpatient psychiatric treatment.   Diagnosis: Polysubstance Abuse  Past Medical History: No past medical history on file.  Past Surgical History:  Procedure Laterality Date  . APPLICATION OF WOUND VAC Left 02/07/2014   Procedure: APPLICATION OF WOUND VAC;  Surgeon: Cheral Almas, MD;  Location: [facility];  Service: Orthopedics;  Laterality: Left;  . FOOT SURGERY    . I&D EXTREMITY Left 02/04/2014   Procedure: IRRIGATION AND DEBRIDEMENT LEFT FOOT ABSCESS;  Surgeon: Cheral Almas, MD;  Location: [facility];  Service: Orthopedics;  Laterality: Left;  . I&D EXTREMITY Left 02/07/2014   Procedure: IRRIGATION AND DEBRIDEMENT LEFT FOOT, WOUND VAC;  Surgeon: Cheral Almas, MD;  [facility];  Service: Orthopedics;  Laterality: Left;  . I&D EXTREMITY Left 02/11/2014   Procedure: IRRIGATION AND DEBRIDEMENT LEFT FOOT, SKIN GRAFT TAKEN FROM LEFT UPPER THIGH;  Surgeon: Cheral Almas, MD;  Location: [facility];  Service: Orthopedics;  Laterality: Left;     Family History: No family history on file.  Social History:  reports that he has been smoking Cigarettes.  He has a 2.00 pack-year smoking history. He has never used smokeless tobacco. He reports that he uses drugs, including Marijuana. He reports that he does not drink alcohol.  Additional Social History:  Alcohol / Drug Use Pain Medications: SEE MAR Prescriptions: SEE MAR Over the Counter: SEE MAR Longest period of sobriety (when/how long): none Negative Consequences of Use: Financial, Legal, Personal relationships Withdrawal Symptoms: Patient aware of relationship between substance abuse and physical/medical complications  CIWA:   COWS:    PATIENT STRENGTHS: (choose at least two) Ability for insight Active sense of humor Average or above average intelligence  Allergies: No Known Allergies  Home Medications:  (Not in a hospital admission)  OB/GYN Status:  No LMP for male patient.  General Assessment Data Location of Assessment: [facility] Assessment Services TTS Assessment: In system Is this a Tele or Face-to-Face Assessment?: Face-to-Face Is this an Initial Assessment or a Re-assessment for this encounter?: Initial Assessment Marital status: Single Maiden name: n/a Is patient pregnant?: No Pregnancy Status: No Living Arrangements: Other relatives Can pt return to current living arrangement?: Yes Admission Status: Voluntary Is patient capable of signing voluntary admission?: Yes Referral Source: Self/Family/Friend Insurance type: SP  Medical Screening Exam [facility] Walk-in ONLY) Medical Exam completed: Yes  Crisis Care Plan Living Arrangements: Other relatives Name of Psychiatrist: none Name of Therapist: none  Education Status Is patient currently in school?: No Current Grade: n/a Highest grade of school patient has completed: 11th Name of  school: n/a Contact person: mother, father  Risk to self with the past 6 months Suicidal Ideation: No Has patient been a  risk to self within the past 6 months prior to admission? : No Suicidal Intent: No Has patient had any suicidal intent within the past 6 months prior to admission? : No Is patient at risk for suicide?: No Suicidal Plan?: No Has patient had any suicidal plan within the past 6 months prior to admission? : No Access to Means: No What has been your use of drugs/alcohol within the last 12 months?: cocaine, cannabis, alcohol Previous Attempts/Gestures: No How many times?: 0 Other Self Harm Risks: cigarette burn to self Triggers for Past Attempts: None known Intentional Self Injurious Behavior: Damaging Comment - Self Injurious Behavior: cigarteet burns on self Family Suicide History: No Recent stressful life event(s): Turmoil (Comment) Persecutory voices/beliefs?: No Depression: Yes Depression Symptoms: Tearfulness, Isolating, Fatigue, Guilt, Loss of interest in usual pleasures Substance abuse history and/or treatment for substance abuse?: Yes Suicide prevention information given to non-admitted patients: Not applicable  Risk to Others within the past 6 months Homicidal Ideation: Yes-Currently Present Does patient have any lifetime risk of violence toward others beyond the six months prior to admission? : No Thoughts of Harm to Others: No Current Homicidal Intent: No Current Homicidal Plan: No Access to Homicidal Means: No Identified Victim: none History of harm to others?: No Assessment of Violence: In past 6-12 months Violent Behavior Description: hit people, damage property Does patient have access to weapons?: No Criminal Charges Pending?: Yes Describe Pending Criminal Charges: robbery with weapon, [place] Does patient have a court date: Yes Court Date: 01/30/17 Is patient on probation?: No  Psychosis Hallucinations: None noted Delusions: None noted  Mental Status Report Appearance/Hygiene: Unremarkable Eye Contact: Poor Motor Activity: Freedom of movement Speech:  Logical/coherent Level of Consciousness: Alert Mood: Depressed Affect: Depressed Anxiety Level: Moderate Thought Processes: Relevant Judgement: Impaired Orientation: Person, Place, Time, Situation, Appropriate for developmental age Obsessive Compulsive Thoughts/Behaviors: Moderate  Cognitive Functioning Concentration: Decreased Memory: Recent Intact, Remote Intact IQ: Average Insight: Fair Impulse Control: Poor Appetite: Fair Weight Loss: 20 Weight Gain: 0 Sleep: Decreased Total Hours of Sleep: 4 Vegetative Symptoms: None  ADLScreening [facility] Assessment Services) Patient's cognitive ability adequate to safely complete daily activities?: Yes Patient able to express need for assistance with ADLs?: Yes Independently performs ADLs?: Yes (appropriate for developmental age)  Prior Inpatient Therapy Prior Inpatient Therapy: No Prior Therapy Dates: n/a Prior Therapy Facilty/Provider(s): n/a Reason for Treatment: n/a  Prior Outpatient Therapy Prior Outpatient Therapy: No Prior Therapy Dates: n/a Prior Therapy Facilty/Provider(s): n/a Reason for Treatment: n/a Does patient have an ACCT team?: No Does patient have Intensive In-House Services?  : No Does patient have [facility] services? : No Does patient have P4CC services?: No  ADL Screening (condition at time of admission) Patient's cognitive ability adequate to safely complete daily activities?: Yes Is the patient deaf or have difficulty hearing?: No Does the patient have difficulty seeing, even when wearing glasses/contacts?: No Does the patient have difficulty concentrating, remembering, or making decisions?: No Patient able to express need for assistance with ADLs?: Yes Does the patient have difficulty dressing or bathing?: No Independently performs ADLs?: Yes (appropriate for developmental age) Does the patient have difficulty walking or climbing stairs?: No Weakness of Legs: None Weakness of Arms/Hands: None        Abuse/Neglect Assessment (Assessment to be complete while patient is alone) Physical Abuse: Denies Verbal Abuse: Denies Sexual Abuse: Denies Exploitation  of patient/patient's resources: Denies Self-Neglect: Denies Values / Beliefs Cultural Requests During Hospitalization: None Spiritual Requests During Hospitalization: None   Advance Directives (For Healthcare) Does Patient Have a Medical Advance Directive?: No    Additional Information 1:1 In Past 12 Months?: No CIRT Risk: No Elopement Risk: No Does patient have medical clearance?: Yes     Disposition: Per Donell SievertSpencer Simon, PA does not meet inpatient criteria Disposition Initial Assessment Completed for this Encounter: Yes Disposition of Patient: Outpatient treatment Type of outpatient treatment: Adult  Hipolito BayleyShean K Lavon Horn 01/19/2017 12:57 AM

## 2017-01-19 NOTE — BH Assessment (Signed)
[facility] Assessment Progress Note  Per Nanine MeansJamison Lord, DNP, this pt does not require psychiatric hospitalization at this time.  [facility] referral information for area substance abuse treatment providers.  This has been included in pt's discharge instructions.  Pt's nurse, Aram BeechamCynthia, has been notified.  Doylene Canninghomas Karsen Fellows, MA Triage Specialist [phone]

## 2017-01-19 NOTE — Consult Note (Signed)
[place] [facility] Psychiatry Consult   Reason for Consult:  Cocaine and alcohol abuse Referring Physician:  EDP Patient Identification: Jason Duran MRN:  [id number] Principal Diagnosis: Cocaine abuse with cocaine-induced mood disorder [facility]) Diagnosis:   Patient Active Problem List   Diagnosis Date Noted  . Cocaine abuse with cocaine-induced mood disorder [facility]) [F14.14] 01/19/2017    Priority: High  . Onychomycosis [B35.1] 02/02/2014  . Soft tissue infection of foot [L08.9] 02/01/2014  . Sepsis ([place]) [A41.9] 02/01/2014  . Leukocytosis [D72.829] 02/01/2014  . Hyperkalemia [E87.5] 02/01/2014  . Cellulitis of left foot [L03.116] 02/01/2014    Total Time spent with patient: 45 minutes  Subjective:   Jason Duran is a 31 y.o. male patient does not warrant admission.  HPI:  31 yo male who presented to [facility] for alcohol/drug abuse.  However, he was only positive for cocaine despite reporting drinking 6-8 beers every day including prior to going to [facility].  No withdrawal symptoms or suicidal/homicidal ideations, hallucinations.  He does have court dates on 3/22 and 26 for armed robbery and other charges.  Stable for discharge with substance abuse resources.    Past Psychiatric History: cocaine abuse and alcohol use  Risk to Self: Is patient at risk for suicide?: No, but patient needs Medical Clearance Risk to Others:  None Prior Inpatient Therapy:  None Prior Outpatient Therapy:  None  Past Medical History: History reviewed. No pertinent past medical history.  Past Surgical History:  Procedure Laterality Date  . APPLICATION OF WOUND VAC Left 02/07/2014   Procedure: APPLICATION OF WOUND VAC;  Surgeon: Marianna Payment, MD;  [facility];  Service: Orthopedics;  Laterality: Left;  . FOOT SURGERY    . I&D EXTREMITY Left 02/04/2014   Procedure: IRRIGATION AND DEBRIDEMENT LEFT FOOT ABSCESS;  Surgeon: Marianna Payment, MD;  [facility];  Service: Orthopedics;  Laterality: Left;   . I&D EXTREMITY Left 02/07/2014   Procedure: IRRIGATION AND DEBRIDEMENT LEFT FOOT, WOUND VAC;  Surgeon: Marianna Payment, MD;  [facility];  Service: Orthopedics;  Laterality: Left;  . I&D EXTREMITY Left 02/11/2014   Procedure: IRRIGATION AND DEBRIDEMENT LEFT FOOT, SKIN GRAFT TAKEN FROM LEFT UPPER THIGH;  Surgeon: Marianna Payment, MD;  [facility];  Service: Orthopedics;  Laterality: Left;   Family History: No family history on file. Family Psychiatric  History: none Social History:  History  Alcohol Use  . Yes    Comment: patient states he drinks 60-70 40 oz. cans of beer per week     History  Drug Use  . Frequency: 28.0 times per week  . Types: Marijuana, Cocaine    Social History   Social History  . Marital status: Single    Spouse name: N/A  . Number of children: N/A  . Years of education: N/A   Social History Main Topics  . Smoking status: Current Every Day Smoker    Packs/day: 2.00    Years: 10.00    Types: Cigarettes  . Smokeless tobacco: Never Used  . Alcohol use Yes     Comment: patient states he drinks 60-70 40 oz. cans of beer per week  . Drug use: Yes    Frequency: 28.0 times per week    Types: Marijuana, Cocaine  . Sexual activity: Yes    Birth control/ protection: None     Comment: monogamous   Other Topics Concern  . None   Social History Narrative  . None   Additional Social History:  Allergies:  No Known Allergies  Labs:  Results for orders placed or performed during the hospital encounter of 01/19/17 (from the past 48 hour(s))  Acetaminophen level     Status: Abnormal   Collection Time: 01/19/17  8:15 AM  Result Value Ref Range   Acetaminophen (Tylenol), Serum <10 (L) 10 - 30 ug/mL    Comment:        THERAPEUTIC CONCENTRATIONS VARY SIGNIFICANTLY. A RANGE OF 10-30 ug/mL MAY BE AN EFFECTIVE CONCENTRATION FOR MANY PATIENTS. HOWEVER, SOME ARE BEST TREATED AT CONCENTRATIONS OUTSIDE THIS RANGE. ACETAMINOPHEN  CONCENTRATIONS >150 ug/mL AT 4 HOURS AFTER INGESTION AND >50 ug/mL AT 12 HOURS AFTER INGESTION ARE OFTEN ASSOCIATED WITH TOXIC REACTIONS.   Comprehensive metabolic panel     Status: Abnormal   Collection Time: 01/19/17  8:15 AM  Result Value Ref Range   Sodium 138 135 - 145 mmol/L   Potassium 4.2 3.5 - 5.1 mmol/L   Chloride 109 101 - 111 mmol/L   CO2 24 22 - 32 mmol/L   Glucose, Bld 103 (H) 65 - 99 mg/dL   BUN 15 6 - 20 mg/dL   Creatinine, Ser 1.22 0.61 - 1.24 mg/dL   Calcium 8.8 (L) 8.9 - 10.3 mg/dL   Total Protein 6.6 6.5 - 8.1 g/dL   Albumin 4.2 3.5 - 5.0 g/dL   AST 33 15 - 41 U/L   ALT 26 17 - 63 U/L   Alkaline Phosphatase 46 38 - 126 U/L   Total Bilirubin 0.5 0.3 - 1.2 mg/dL   GFR calc non Af Amer >60 >60 mL/min   GFR calc Af Amer >60 >60 mL/min    Comment: (NOTE) The eGFR has been calculated using the CKD EPI equation. This calculation has not been validated in all clinical situations. eGFR's persistently <60 mL/min signify possible Chronic Kidney Disease.    Anion gap 5 5 - 15  Ethanol     Status: None   Collection Time: 01/19/17  8:15 AM  Result Value Ref Range   Alcohol, Ethyl (B) <5 <5 mg/dL    Comment:        LOWEST DETECTABLE LIMIT FOR SERUM ALCOHOL IS 5 mg/dL FOR MEDICAL PURPOSES ONLY   Salicylate level     Status: None   Collection Time: 01/19/17  8:15 AM  Result Value Ref Range   Salicylate Lvl <9.7 2.8 - 30.0 mg/dL  CBC with Differential     Status: None   Collection Time: 01/19/17  8:15 AM  Result Value Ref Range   WBC 5.7 4.0 - 10.5 K/uL   RBC 4.95 4.22 - 5.81 MIL/uL   Hemoglobin 15.4 13.0 - 17.0 g/dL   HCT 44.3 39.0 - 52.0 %   MCV 89.5 78.0 - 100.0 fL   MCH 31.1 26.0 - 34.0 pg   MCHC 34.8 30.0 - 36.0 g/dL   RDW 13.0 11.5 - 15.5 %   Platelets 218 150 - 400 K/uL   Neutrophils Relative % 55 %   Neutro Abs 3.1 1.7 - 7.7 K/uL   Lymphocytes Relative 34 %   Lymphs Abs 1.9 0.7 - 4.0 K/uL   Monocytes Relative 9 %   Monocytes Absolute 0.5 0.1 -  1.0 K/uL   Eosinophils Relative 2 %   Eosinophils Absolute 0.1 0.0 - 0.7 K/uL   Basophils Relative 0 %   Basophils Absolute 0.0 0.0 - 0.1 K/uL  Urine rapid drug screen (hosp performed)     Status: Abnormal   Collection Time: 01/19/17  9:45 AM  Result Value Ref Range   Opiates NONE DETECTED NONE DETECTED   Cocaine POSITIVE (A) NONE DETECTED   Benzodiazepines NONE DETECTED NONE DETECTED   Amphetamines NONE DETECTED NONE DETECTED   Tetrahydrocannabinol NONE DETECTED NONE DETECTED   Barbiturates NONE DETECTED NONE DETECTED    Comment:        DRUG SCREEN FOR MEDICAL PURPOSES ONLY.  IF CONFIRMATION IS NEEDED FOR ANY PURPOSE, NOTIFY LAB WITHIN 5 DAYS.        LOWEST DETECTABLE LIMITS FOR URINE DRUG SCREEN Drug Class       Cutoff (ng/mL) Amphetamine      1000 Barbiturate      200 Benzodiazepine   697 Tricyclics       948 Opiates          300 Cocaine          300 THC              50     Current Facility-Administered Medications  Medication Dose Route Frequency Provider Last Rate Last Dose  . acetaminophen (TYLENOL) tablet 650 mg  650 mg Oral Q4H PRN Sherwood Gambler, MD      . ibuprofen (ADVIL,MOTRIN) tablet 600 mg  600 mg Oral Q8H PRN Sherwood Gambler, MD      . LORazepam (ATIVAN) tablet 0-4 mg  0-4 mg Oral Q6H Sherwood Gambler, MD       Followed by  . [START ON 01/21/2017] LORazepam (ATIVAN) tablet 0-4 mg  0-4 mg Oral Q12H Sherwood Gambler, MD      . thiamine (VITAMIN B-1) tablet 100 mg  100 mg Oral Daily Sherwood Gambler, MD   100 mg at 01/19/17 1038   Or  . thiamine (B-1) injection 100 mg  100 mg Intravenous Daily Sherwood Gambler, MD       Current Outpatient Prescriptions  Medication Sig Dispense Refill  . oxyCODONE-acetaminophen (PERCOCET) 5-325 MG tablet Take 1 tablet by mouth every 4 (four) hours as needed for severe pain. (Patient not taking: Reported on 01/19/2017) 20 tablet 0  . promethazine (PHENERGAN) 25 MG tablet Take 1 tablet (25 mg total) by mouth every 6 (six) hours as needed  for nausea or vomiting. (Patient not taking: Reported on 01/19/2017) 30 tablet 0    Musculoskeletal: Strength & Muscle Tone: within normal limits Gait & Station: normal Patient leans: N/A  Psychiatric Specialty Exam: Physical Exam  Constitutional: He is oriented to person, place, and time. He appears well-developed and well-nourished.  HENT:  Head: Normocephalic.  Respiratory: Effort normal.  Musculoskeletal: Normal range of motion.  Neurological: He is alert and oriented to person, place, and time.  Psychiatric: His speech is normal and behavior is normal. Judgment and thought content normal. Cognition and memory are normal. He exhibits a depressed mood.    Review of Systems  Psychiatric/Behavioral: Positive for depression and substance abuse.  All other systems reviewed and are negative.   Blood pressure 110/60, pulse 65, temperature 97.7 F (36.5 C), temperature source Oral, resp. rate 20, height '5\' 10"'  (1.778 m), weight 63.5 kg (140 lb), SpO2 99 %.Body mass index is 20.09 kg/m.  General Appearance: Casual  Eye Contact:  Good  Speech:  Normal Rate  Volume:  Normal  Mood:  Depressed, mild  Affect:  Congruent  Thought Process:  Coherent and Descriptions of Associations: Intact  Orientation:  Full (Time, Place, and Person)  Thought Content:  WDL and Logical  Suicidal Thoughts:  No  Homicidal Thoughts:  No  Memory:  Immediate;   Good Recent;   Good Remote;   Good  Judgement:  Fair  Insight:  Fair  Psychomotor Activity:  Normal  Concentration:  Concentration: Good and Attention Span: Good  Recall:  Good  Fund of Knowledge:  Fair  Language:  Good  Akathisia:  No  Handed:  Right  AIMS (if indicated):     Assets:  Housing Leisure Time Physical Health Resilience Social Support  ADL's:  Intact  Cognition:  WNL  Sleep:        Treatment Plan Summary: Daily contact with patient to assess and evaluate symptoms and progress in treatment, Medication management and Plan  cocaine abuse with cocaine induced mood disorder:  -Crisis stabilization -Medication management:  None started, needed to clear the cocaine -Individual and substance abuse counseling -Rehab resources provided  Disposition: No evidence of imminent risk to self or others at present.    Waylan Boga, NP 01/19/2017 10:46 AM  Patient seen face-to-face for psychiatric evaluation, chart reviewed and case discussed with the physician extender and developed treatment plan. Reviewed the information documented and agree with the treatment plan. Corena Pilgrim, MD

## 2017-01-19 NOTE — BH Assessment (Signed)
Assessment Note  Jason Duran is a 31 year old African American that presented as a walk in to [facility] after being discharged from [facility] 3 hours earlier with outpatient resources.   Patient now reports SI.  Patient would not disclose what his plan is or if he has the means to carry out his plan to harm himself.    Patient reports increased depression with his inability to stop abusing drugs.  Patient reports that he abused cocaine daily.  Patient does not remember how much he uses daily.  Patient reports that his last use was last night before he came to [facility].   Patient reports that he lives with his grandmother.  Patient denies prior inpatient psychiatric hospitalization.  Patient denies HI and Psychosis.    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   Past Medical History: No past medical history on file.  Past Surgical History:  Procedure Laterality Date  . APPLICATION OF WOUND VAC Left 02/07/2014   Procedure: APPLICATION OF WOUND VAC;  Surgeon: Cheral Almas, MD;  Location: [facility];  Service: Orthopedics;  Laterality: Left;  . FOOT SURGERY    . I&D EXTREMITY Left 02/04/2014   Procedure: IRRIGATION AND DEBRIDEMENT LEFT FOOT ABSCESS;  Surgeon: Cheral Almas, MD;  Location: [facility];  Service: Orthopedics;  Laterality: Left;  . I&D EXTREMITY Left 02/07/2014   Procedure: IRRIGATION AND DEBRIDEMENT LEFT FOOT, WOUND VAC;  Surgeon: Cheral Almas, MD;  Location: [facility];  Service: Orthopedics;  Laterality: Left;  . I&D EXTREMITY Left 02/11/2014   Procedure: IRRIGATION AND DEBRIDEMENT LEFT FOOT, SKIN GRAFT TAKEN FROM LEFT UPPER THIGH;  Surgeon: Cheral Almas, MD;  Location: [facility];  Service: Orthopedics;  Laterality: Left;    Family History: No family history on file.  Social History:  reports that he has been smoking Cigarettes.  He has a 20.00 pack-year smoking history. He has never used smokeless tobacco. He reports that he drinks alcohol. He  reports that he uses drugs, including Marijuana and Cocaine, about 28 times per week.  Additional Social History:  Alcohol / Drug Use History of alcohol / drug use?: Yes Longest period of sobriety (when/how long): Unable to remember Negative Consequences of Use: Financial, Legal, Personal relationships, Work / School Withdrawal Symptoms:  (None Reported) Substance #1 Name of Substance 1: Cocaine 1 - Age of First Use: 31yo 1 - Amount (size/oz): Varies  1 - Frequency: Varies  1 - Duration: 1 Year  1 - Last Use / Amount: Last night - unable to remember how much he used.  Patient reports that he used a lot.   CIWA:   COWS:    Allergies: No Known Allergies  Home Medications:  (Not in a hospital admission)  OB/GYN Status:  No LMP for male patient.  General Assessment Data Location of Assessment: [facility] Assessment Services TTS Assessment: In system Is this a Tele or Face-to-Face Assessment?: Face-to-Face Is this an Initial Assessment or a Re-assessment for this encounter?: Initial Assessment Marital status: Single Maiden name: NA Is patient pregnant?: No Pregnancy Status: No Living Arrangements: Other relatives Can pt return to current living arrangement?: Yes Admission Status: Voluntary Is patient capable of signing voluntary admission?: Yes Referral Source: Self/Family/Friend Insurance type: Self Pay   Medical Screening Exam [facility] Walk-in ONLY) Medical Exam completed: Yes Karleen Hampshire Simpon, PA completed MSE Exam )  Crisis Care Plan Living Arrangements: Other relatives Legal Guardian:  (None Reported) Name of Psychiatrist: None Reported Name of  Therapist: None Reported  Education Status Is patient currently in school?: No Current Grade: NA Highest grade of school patient has completed: 11TH Name of school: NA Contact person: NA  Risk to self with the past 6 months Suicidal Ideation: Yes-Currently Present Has patient been a risk to self within the past 6 months prior to  admission? : Yes Suicidal Intent: Yes-Currently Present Has patient had any suicidal intent within the past 6 months prior to admission? : Yes Is patient at risk for suicide?: Yes Suicidal Plan?: Yes-Currently Present Has patient had any suicidal plan within the past 6 months prior to admission? : Yes Specify Current Suicidal Plan: Pt would not state what his plan is  Access to Means: Yes Specify Access to Suicidal Means: Patient would not state. What has been your use of drugs/alcohol within the last 12 months?: Cocaine  Previous Attempts/Gestures: No How many times?: 0 Other Self Harm Risks: Cigarette Burns Triggers for Past Attempts: None known Intentional Self Injurious Behavior: None Comment - Self Injurious Behavior: Cigarette Burns Family Suicide History: No Recent stressful life event(s): Turmoil (Comment) Persecutory voices/beliefs?: No Depression: Yes Depression Symptoms: Despondent, Insomnia, Tearfulness, Isolating, Fatigue, Guilt, Loss of interest in usual pleasures, Feeling worthless/self pity, Feeling angry/irritable Substance abuse history and/or treatment for substance abuse?: Yes Suicide prevention information given to non-admitted patients: Not applicable  Risk to Others within the past 6 months Homicidal Ideation: Yes-Currently Present Does patient have any lifetime risk of violence toward others beyond the six months prior to admission? : No Thoughts of Harm to Others: No Current Homicidal Intent: No Current Homicidal Plan: No Access to Homicidal Means: No Identified Victim: None Reported History of harm to others?: No Assessment of Violence: In past 6-12 months Violent Behavior Description: HItting other and damaging property  Does patient have access to weapons?: No Criminal Charges Pending?: Yes Describe Pending Criminal Charges: Robbery with a dangerous weapon Does patient have a court date: Yes Court Date: 01/30/17 Is patient on probation?:  No  Psychosis Hallucinations: None noted Delusions: None noted  Mental Status Report Appearance/Hygiene: Body odor, Disheveled, Layered clothes, Poor hygiene Eye Contact: Good Motor Activity: Freedom of movement, Agitation, Restlessness Speech: Logical/coherent Level of Consciousness: Alert Mood: Depressed, Suspicious, Despair, Helpless Affect: Depressed Anxiety Level: Minimal Thought Processes: Coherent, Relevant Judgement: Impaired Orientation: Person, Place, Time, Situation, Appropriate for developmental age Obsessive Compulsive Thoughts/Behaviors: None  Cognitive Functioning Concentration: Decreased Memory: Recent Intact, Remote Intact IQ: Average Insight: Poor Impulse Control: Poor Appetite: Fair Weight Loss: 0 Weight Gain: 0 Sleep: Decreased Total Hours of Sleep: 2 Vegetative Symptoms: Decreased grooming, Not bathing, Staying in bed  ADLScreening [facility] Assessment Services) Patient's cognitive ability adequate to safely complete daily activities?: Yes Patient able to express need for assistance with ADLs?: Yes Independently performs ADLs?: Yes (appropriate for developmental age)  Prior Inpatient Therapy Prior Inpatient Therapy: No Prior Therapy Dates: NA Prior Therapy Facilty/Provider(s): NA Reason for Treatment: NA  Prior Outpatient Therapy Prior Outpatient Therapy: No Prior Therapy Dates: NA Prior Therapy Facilty/Provider(s): NA Reason for Treatment: NA Does patient have an ACCT team?: No Does patient have Intensive In-House Services?  : No Does patient have [facility] services? : No Does patient have P4CC services?: No  ADL Screening (condition at time of admission) Patient's cognitive ability adequate to safely complete daily activities?: Yes Is the patient deaf or have difficulty hearing?: No Does the patient have difficulty seeing, even when wearing glasses/contacts?: No Does the patient have difficulty concentrating, remembering, or making decisions?:  Yes Patient able to express need for assistance with ADLs?: Yes Does the patient have difficulty dressing or bathing?: No Independently performs ADLs?: Yes (appropriate for developmental age) Does the patient have difficulty walking or climbing stairs?: No Weakness of Legs: None Weakness of Arms/Hands: None  Home Assistive Devices/Equipment Home Assistive Devices/Equipment: None    Abuse/Neglect Assessment (Assessment to be complete while patient is alone) Physical Abuse: Denies Verbal Abuse: Denies Sexual Abuse: Denies Exploitation of patient/patient's resources: Denies Self-Neglect: Denies Values / Beliefs Cultural Requests During Hospitalization: None Spiritual Requests During Hospitalization: None Consults Spiritual Care Consult Needed: No Social Work Consult Needed: No Merchant navy officerAdvance Directives (For Healthcare) Does Patient Have a Medical Advance Directive?: No Would patient like information on creating a medical advance directive?: No - Patient declined    Additional Information 1:1 In Past 12 Months?: No CIRT Risk: No Elopement Risk: No Does patient have medical clearance?: Yes     Disposition: Per Donell SievertSpencer Simon, PA - patient meets criteria for inpatient hospitalization Disposition Initial Assessment Completed for this Encounter: Yes Disposition of Patient: Inpatient treatment program Type of inpatient treatment program: Adult Type of outpatient treatment: Adult  On Site Evaluation by:   Reviewed with Physician:    Phillip HealStevenson, Sabin Gibeault [place] 01/19/2017 8:16 AM

## 2017-01-19 NOTE — H&P (Signed)
Behavioral Health Medical Screening Exam  Jason Duran is an 31 y.o. male, presents for a second time within hours accompanied with his parents now endorsing SI without plan in setting of continued illicit drug use. Patient endorses crack cocaine use daily but didn't specify quantity or dollar value. He us denying any acute physical ailments or prior psychiatric history.  Total Time spent with patient: 20 minutes  Psychiatric Specialty Exam: Physical Exam  Constitutional: He is oriented to person, place, and time.  HENT:  Head: Normocephalic.  Eyes: Pupils are equal, round, and reactive to light.  Neurological: He is alert and oriented to person, place, and time. No cranial nerve deficit.  Skin: Skin is warm and dry.  Psychiatric: His speech is normal. His mood appears anxious. His affect is angry. He is agitated. Cognition and memory are impaired. He expresses impulsivity. He exhibits a depressed mood. He expresses homicidal and suicidal ideation.    Review of Systems  Psychiatric/Behavioral: Positive for depression, hallucinations, substance abuse and suicidal ideas. The patient is nervous/anxious and has insomnia.   All other systems reviewed and are negative.   There were no vitals taken for this visit.There is no height or weight on file to calculate BMI.  General Appearance: Disheveled  Eye Contact:  Poor  Speech:  Garbled  Volume:  Normal  Mood:  Irritable  Affect:  Congruent  Thought Process:  Goal Directed  Orientation:  Full (Time, Place, and Person)  Thought Content:  Hallucinations: Visual  Suicidal Thoughts:  Yes.  without intent/plan  Homicidal Thoughts:  Yes.  without intent/plan  Memory:  Recent;   Fair  Judgement:  Poor  Insight:  Lacking  Psychomotor Activity:  Negative  Concentration: Concentration: Poor  Recall:  FiservFair  Fund of Knowledge:Fair  Language: Fair  Akathisia:  Negative  Handed:  Right  AIMS (if indicated):     Assets:  Desire for Improvement   Sleep:       Musculoskeletal: Strength & Muscle Tone: within normal limits Gait & Station: normal Patient leans: N/A  There were no vitals taken for this visit.  Recommendations:  Based on my evaluation the patient appears to have an emergency medical condition for which I recommend the patient be transferred to the emergency department for further evaluation.  Demaree Liberto E, PA-C 01/19/2017, 7:16 AM

## 2017-01-19 NOTE — ED Provider Notes (Signed)
[facility]-EMERGENCY DEPT Provider Note   CSN: [id number] Arrival date & time: 01/19/17  4098     History   Chief Complaint Chief Complaint  Patient presents with  . Medical Clearance    HPI Jason Duran is a 31 y.o. male.  HPI  31 year old male presents from behavioral health for medical clearance. Jason Duran walked in there this morning asking for rehabilitation for cocaine abuse. Jason Duran has been using cocaine daily as well as large amounts of alcohol daily "for a long time". Jason Duran has also had suicidal thoughts without an attempt or plan for the last 1 week. Jason Duran otherwise denies being ill including no fevers, vomiting, pain.  History reviewed. No pertinent past medical history.  Patient Active Problem List   Diagnosis Date Noted  . Cocaine abuse with cocaine-induced mood disorder (HCC) 01/19/2017  . Onychomycosis 02/02/2014  . Soft tissue infection of foot 02/01/2014  . Sepsis (HCC) 02/01/2014  . Leukocytosis 02/01/2014  . Hyperkalemia 02/01/2014  . Cellulitis of left foot 02/01/2014    Past Surgical History:  Procedure Laterality Date  . APPLICATION OF WOUND VAC Left 02/07/2014   Procedure: APPLICATION OF WOUND VAC;  Surgeon: Cheral Almas, MD;  Location: [facility];  Service: Orthopedics;  Laterality: Left;  . FOOT SURGERY    . I&D EXTREMITY Left 02/04/2014   Procedure: IRRIGATION AND DEBRIDEMENT LEFT FOOT ABSCESS;  Surgeon: Cheral Almas, MD;  Location: [facility];  Service: Orthopedics;  Laterality: Left;  . I&D EXTREMITY Left 02/07/2014   Procedure: IRRIGATION AND DEBRIDEMENT LEFT FOOT, WOUND VAC;  Surgeon: Cheral Almas, MD;  Location: [facility];  Service: Orthopedics;  Laterality: Left;  . I&D EXTREMITY Left 02/11/2014   Procedure: IRRIGATION AND DEBRIDEMENT LEFT FOOT, SKIN GRAFT TAKEN FROM LEFT UPPER THIGH;  Surgeon: Cheral Almas, MD;  Location: [facility];  Service: Orthopedics;  Laterality: Left;       Home Medications    Prior to Admission medications   Medication Sig  Start Date End Date Taking? Authorizing Provider  oxyCODONE-acetaminophen (PERCOCET) 5-325 MG tablet Take 1 tablet by mouth every 4 (four) hours as needed for severe pain. Patient not taking: Reported on 01/19/2017 08/16/15   Mancel Bale, MD  promethazine (PHENERGAN) 25 MG tablet Take 1 tablet (25 mg total) by mouth every 6 (six) hours as needed for nausea or vomiting. Patient not taking: Reported on 01/19/2017 08/16/15   Mancel Bale, MD    Family History No family history on file.  Social History Social History  Substance Use Topics  . Smoking status: Current Every Day Smoker    Packs/day: 2.00    Years: 10.00    Types: Cigarettes  . Smokeless tobacco: Never Used  . Alcohol use Yes     Comment: patient states Jason Duran drinks 60-70 40 oz. cans of beer per week     Allergies   Patient has no known allergies.   Review of Systems Review of Systems  Constitutional: Negative for fever.  Cardiovascular: Negative for chest pain.  Gastrointestinal: Negative for diarrhea and vomiting.  Psychiatric/Behavioral: Positive for suicidal ideas.  All other systems reviewed and are negative.    Physical Exam Updated Vital Signs BP 115/77 (BP Location: Right Arm)   Pulse 76   Temp 98.7 F (37.1 C) (Oral)   Resp 20   Ht 5\' 10"  (1.778 m)   Wt 140 lb (63.5 kg)   SpO2 97%   BMI 20.09 kg/m   Physical Exam  Constitutional: Jason Duran is oriented  to person, place, and time. Jason Duran appears well-developed and well-nourished. No distress.  HENT:  Head: Normocephalic and atraumatic.  Right Ear: External ear normal.  Left Ear: External ear normal.  Nose: Nose normal.  Eyes: Right eye exhibits no discharge. Left eye exhibits no discharge.  Neck: Neck supple.  Cardiovascular: Normal rate, regular rhythm and normal heart sounds.   Pulmonary/Chest: Effort normal and breath sounds normal.  Abdominal: Soft. There is no tenderness.  Musculoskeletal: Jason Duran exhibits no edema.  Neurological: Jason Duran is alert and  oriented to person, place, and time.  Skin: Skin is warm and dry. Jason Duran is not diaphoretic.  Psychiatric: Jason Duran expresses suicidal ideation. Jason Duran expresses no suicidal plans.  Nursing note and vitals reviewed.    ED Treatments / Results  Labs (all labs ordered are listed, but only abnormal results are displayed) Labs Reviewed  ACETAMINOPHEN LEVEL - Abnormal; Notable for the following:       Result Value   Acetaminophen (Tylenol), Serum <10 (*)    All other components within normal limits  COMPREHENSIVE METABOLIC PANEL - Abnormal; Notable for the following:    Glucose, Bld 103 (*)    Calcium 8.8 (*)    All other components within normal limits  RAPID URINE DRUG SCREEN, HOSP PERFORMED - Abnormal; Notable for the following:    Cocaine POSITIVE (*)    All other components within normal limits  ETHANOL  SALICYLATE LEVEL  CBC WITH DIFFERENTIAL/PLATELET    EKG  EKG Interpretation None       Radiology No results found.  Procedures Procedures (including critical care time)  Medications Ordered in ED Medications - No data to display   Initial Impression / Assessment and Plan / ED Course  I have reviewed the triage vital signs and the nursing notes.  Pertinent labs & imaging results that were available during my care of the patient were reviewed by me and considered in my medical decision making (see chart for details).  Clinical Course as of Jan 19 2006  Thu Jan 19, 2017  0820 Vital signs are unremarkable. Patient will be medically cleared with labs and then will admit to psych for his detox and suicidal thoughts.  [SG]  [id number] Patient's labs unremarkable. No UDS  yet. Medically cleared for psych disposition.  [SG]    Clinical Course User Index [SG] Pricilla LovelessScott Jaeden Westbay, MD    Final Clinical Impressions(s) / ED Diagnoses   Final diagnoses:  Cocaine abuse  Alcohol abuse  Cocaine abuse with cocaine-induced mood disorder [facility](HCC)    New Prescriptions Discharge Medication List as of  01/19/2017 11:09 AM       Pricilla LovelessScott Cabria Micalizzi, MD 01/19/17 2007

## 2017-01-19 NOTE — H&P (Signed)
Behavioral Health Medical Screening Exam  Jason Duran is an 31 y.o. male, accompanied with his parents presenting wanting drug rehab information due to ongoing polysubstance abuse. Patient is denying nay depression, SI. SA but endorses HI to anyone who would mess with him. There is no reported hx of prior psychiatric history.  Total Time spent with patient: 20 minutes  Psychiatric Specialty Exam: Physical Exam  Constitutional: He is oriented to person, place, and time. He appears distressed.  HENT:  Head: Normocephalic.  Respiratory: Effort normal and breath sounds normal.  Neurological: He is alert and oriented to person, place, and time. No cranial nerve deficit.  Skin: Skin is warm and dry.  Psychiatric: His speech is normal. His mood appears anxious. His affect is angry. He is agitated. Cognition and memory are impaired. He expresses impulsivity. He does not exhibit a depressed mood. He expresses homicidal ideation.    Review of Systems  Psychiatric/Behavioral: Positive for substance abuse. Negative for depression, hallucinations and suicidal ideas. The patient has insomnia.   All other systems reviewed and are negative.   There were no vitals taken for this visit.There is no height or weight on file to calculate BMI.  General Appearance: Disheveled  Eye Contact:  Poor  Speech:  Garbled  Volume:  Normal  Mood:  Irritable  Affect:  Constricted  Thought Process:  Goal Directed  Orientation:  Full (Time, Place, and Person)  Thought Content:  Negative  Suicidal Thoughts:  No  Homicidal Thoughts:  Yes.  without intent/plan  Memory:  Recent;   Poor  Judgement:  Impaired  Insight:  Lacking  Psychomotor Activity:  Negative  Concentration: Concentration: Fair  Recall:  [place]: Fair  Akathisia:  Negative  Handed:  Right  AIMS (if indicated):     Assets:  Desire for Improvement  Sleep:       Musculoskeletal: Strength & Muscle Tone: within  normal limits Gait & Station: normal Patient leans: N/A  There were no vitals taken for this visit.  Recommendations: Does not met criteria for in-patient admission ( not dual diagnosis) should seek medical attention if symptomatic of acute ailments due to ongoing drug     Shayonna Ocampo E, PA-C 01/19/2017, 1:01 AM

## 2017-01-19 NOTE — Discharge Instructions (Signed)
To help you maintain a sober lifestyle, a substance abuse treatment program may be beneficial to you.  Contact one of the following facilities at your earliest opportunity to ask about enrolling: ° °RESIDENTIAL PROGRAMS: ° °     [facility] °     [address] °     [place], [place] [address] °     [phone] ° °     [facility] °     [address] °     [place], [place] [address] °     [phone] ° °     [facility] °     [address] °     [place], [place] [address] °     [phone] ° °OUTPATIENT PROGRAMS: ° °     [facility] °     [address] °     [place], [place] [address] °     [phone] °     New patients are seen at the walk-in clinic every Tuesday from 9:00 am - 12:00 pm. °

## 2020-06-19 ENCOUNTER — Emergency Department (HOSPITAL_COMMUNITY)
Admission: EM | Admit: 2020-06-19 | Discharge: 2020-06-19 | Disposition: A | Discharge status: Home / Self Care | Payer: Self-pay | Attending: Emergency Medicine | Admitting: Emergency Medicine

## 2020-06-19 ENCOUNTER — Emergency Department (HOSPITAL_COMMUNITY): Payer: Self-pay

## 2020-06-19 ENCOUNTER — Other Ambulatory Visit: Payer: Self-pay

## 2020-06-19 ENCOUNTER — Encounter (HOSPITAL_COMMUNITY): Payer: Self-pay | Admitting: *Deleted

## 2020-06-19 DIAGNOSIS — Y998 Other external cause status: Secondary | ICD-10-CM | POA: Insufficient documentation

## 2020-06-19 DIAGNOSIS — Z23 Encounter for immunization: Secondary | ICD-10-CM | POA: Insufficient documentation

## 2020-06-19 DIAGNOSIS — F17210 Nicotine dependence, cigarettes, uncomplicated: Secondary | ICD-10-CM | POA: Insufficient documentation

## 2020-06-19 DIAGNOSIS — T07XXXA Unspecified multiple injuries, initial encounter: Secondary | ICD-10-CM

## 2020-06-19 DIAGNOSIS — S20312A Abrasion of left front wall of thorax, initial encounter: Secondary | ICD-10-CM | POA: Insufficient documentation

## 2020-06-19 DIAGNOSIS — Y9289 Other specified places as the place of occurrence of the external cause: Secondary | ICD-10-CM | POA: Insufficient documentation

## 2020-06-19 DIAGNOSIS — Y9389 Activity, other specified: Secondary | ICD-10-CM | POA: Insufficient documentation

## 2020-06-19 MED ORDER — TETANUS-DIPHTH-ACELL PERTUSSIS 5-2.5-18.5 LF-MCG/0.5 IM SUSP
0.5000 mL | Freq: Once | INTRAMUSCULAR | Status: AC
  Administered 2020-06-19: 0.5 mL via INTRAMUSCULAR
  Filled 2020-06-19: qty 0.5
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
## 2020-06-26 ENCOUNTER — Other Ambulatory Visit: Payer: Self-pay
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
